# Patient Record
Sex: Male | Born: 1935 | Race: White | Hispanic: No | Marital: Married | Smoking: Former smoker
Health system: Southern US, Community
[De-identification: ages and names within clinical notes are randomized; demographics above are authoritative.]

## PROBLEM LIST (undated history)

## (undated) DIAGNOSIS — G479 Sleep disorder, unspecified: Secondary | ICD-10-CM

## (undated) DIAGNOSIS — T7840XA Allergy, unspecified, initial encounter: Secondary | ICD-10-CM

## (undated) DIAGNOSIS — K635 Polyp of colon: Secondary | ICD-10-CM

## (undated) DIAGNOSIS — K219 Gastro-esophageal reflux disease without esophagitis: Secondary | ICD-10-CM

## (undated) DIAGNOSIS — M1120 Other chondrocalcinosis, unspecified site: Secondary | ICD-10-CM

## (undated) DIAGNOSIS — K5792 Diverticulitis of intestine, part unspecified, without perforation or abscess without bleeding: Secondary | ICD-10-CM

## (undated) DIAGNOSIS — C801 Malignant (primary) neoplasm, unspecified: Secondary | ICD-10-CM

## (undated) HISTORY — DX: Allergy, unspecified, initial encounter: T78.40XA

## (undated) HISTORY — DX: Sleep disorder, unspecified: G47.9

## (undated) HISTORY — DX: Polyp of colon: K63.5

## (undated) HISTORY — DX: Diverticulitis of intestine, part unspecified, without perforation or abscess without bleeding: K57.92

## (undated) HISTORY — DX: Other chondrocalcinosis, unspecified site: M11.20

## (undated) HISTORY — DX: Malignant (primary) neoplasm, unspecified: C80.1

## (undated) HISTORY — PX: PROSTATE SURGERY: SHX751

## (undated) HISTORY — DX: Gastro-esophageal reflux disease without esophagitis: K21.9

---

## 1996-02-04 HISTORY — PX: COLON SURGERY: SHX602

## 2001-06-18 ENCOUNTER — Encounter: Payer: Self-pay | Admitting: Internal Medicine

## 2003-07-25 ENCOUNTER — Encounter (INDEPENDENT_AMBULATORY_CARE_PROVIDER_SITE_OTHER): Payer: Self-pay | Admitting: Internal Medicine

## 2004-05-10 ENCOUNTER — Ambulatory Visit: Payer: Self-pay | Admitting: Internal Medicine

## 2004-07-09 ENCOUNTER — Ambulatory Visit: Payer: Self-pay | Admitting: Family Medicine

## 2004-11-15 ENCOUNTER — Ambulatory Visit: Payer: Self-pay | Admitting: Internal Medicine

## 2004-11-22 ENCOUNTER — Ambulatory Visit: Payer: Self-pay | Admitting: Internal Medicine

## 2004-11-26 ENCOUNTER — Ambulatory Visit: Payer: Self-pay | Admitting: Oncology

## 2004-12-05 ENCOUNTER — Encounter: Payer: Self-pay | Admitting: Internal Medicine

## 2004-12-19 ENCOUNTER — Other Ambulatory Visit: Admission: RE | Admit: 2004-12-19 | Discharge: 2004-12-19 | Payer: Self-pay | Admitting: Oncology

## 2004-12-19 ENCOUNTER — Encounter (INDEPENDENT_AMBULATORY_CARE_PROVIDER_SITE_OTHER): Payer: Self-pay | Admitting: *Deleted

## 2005-01-03 ENCOUNTER — Ambulatory Visit: Payer: Self-pay | Admitting: Internal Medicine

## 2005-01-09 ENCOUNTER — Ambulatory Visit: Payer: Self-pay | Admitting: Internal Medicine

## 2005-01-28 ENCOUNTER — Ambulatory Visit: Payer: Self-pay | Admitting: Family Medicine

## 2005-03-04 ENCOUNTER — Ambulatory Visit: Payer: Self-pay | Admitting: Internal Medicine

## 2005-05-23 ENCOUNTER — Ambulatory Visit: Payer: Self-pay | Admitting: Internal Medicine

## 2005-11-17 ENCOUNTER — Ambulatory Visit: Payer: Self-pay | Admitting: Internal Medicine

## 2006-05-22 ENCOUNTER — Ambulatory Visit: Payer: Self-pay | Admitting: Internal Medicine

## 2006-05-22 LAB — CONVERTED CEMR LAB
BUN: 11 mg/dL (ref 6–23)
Basophils Absolute: 0 10*3/uL (ref 0.0–0.1)
CO2: 28 meq/L (ref 19–32)
Calcium: 8.8 mg/dL (ref 8.4–10.5)
Chloride: 106 meq/L (ref 96–112)
Eosinophils Absolute: 0.1 10*3/uL (ref 0.0–0.6)
Eosinophils Relative: 2.4 % (ref 0.0–5.0)
GFR calc Af Amer: 123 mL/min
Glucose, Bld: 95 mg/dL (ref 70–99)
HCT: 40.4 % (ref 39.0–52.0)
MCV: 105.7 fL — ABNORMAL HIGH (ref 78.0–100.0)
Neutrophils Relative %: 31.2 % — ABNORMAL LOW (ref 43.0–77.0)
Platelets: 122 10*3/uL — ABNORMAL LOW (ref 150–400)
Potassium: 4.3 meq/L (ref 3.5–5.1)
RBC: 3.83 M/uL — ABNORMAL LOW (ref 4.22–5.81)
RDW: 12.8 % (ref 11.5–14.6)
WBC: 3.4 10*3/uL — ABNORMAL LOW (ref 4.5–10.5)

## 2006-10-26 DIAGNOSIS — Z8546 Personal history of malignant neoplasm of prostate: Secondary | ICD-10-CM | POA: Insufficient documentation

## 2006-10-26 DIAGNOSIS — K219 Gastro-esophageal reflux disease without esophagitis: Secondary | ICD-10-CM

## 2006-10-26 DIAGNOSIS — D469 Myelodysplastic syndrome, unspecified: Secondary | ICD-10-CM

## 2006-10-26 DIAGNOSIS — Z8719 Personal history of other diseases of the digestive system: Secondary | ICD-10-CM | POA: Insufficient documentation

## 2006-10-26 DIAGNOSIS — G479 Sleep disorder, unspecified: Secondary | ICD-10-CM | POA: Insufficient documentation

## 2006-10-26 DIAGNOSIS — Z860100 Personal history of colon polyps, unspecified: Secondary | ICD-10-CM | POA: Insufficient documentation

## 2006-10-26 DIAGNOSIS — J45909 Unspecified asthma, uncomplicated: Secondary | ICD-10-CM | POA: Insufficient documentation

## 2006-10-26 DIAGNOSIS — J449 Chronic obstructive pulmonary disease, unspecified: Secondary | ICD-10-CM

## 2006-10-26 DIAGNOSIS — J309 Allergic rhinitis, unspecified: Secondary | ICD-10-CM | POA: Insufficient documentation

## 2006-11-20 ENCOUNTER — Ambulatory Visit: Payer: Self-pay | Admitting: Internal Medicine

## 2006-11-23 LAB — CONVERTED CEMR LAB
ALT: 24 units/L (ref 0–53)
AST: 25 units/L (ref 0–37)
BUN: 7 mg/dL (ref 6–23)
Basophils Relative: 0.4 % (ref 0.0–1.0)
Bilirubin, Direct: 0.4 mg/dL — ABNORMAL HIGH (ref 0.0–0.3)
Creatinine, Ser: 0.8 mg/dL (ref 0.4–1.5)
Eosinophils Relative: 1.7 % (ref 0.0–5.0)
GFR calc Af Amer: 123 mL/min
GFR calc non Af Amer: 102 mL/min
HCT: 41.7 % (ref 39.0–52.0)
Hemoglobin: 14.5 g/dL (ref 13.0–17.0)
Monocytes Absolute: 0.8 10*3/uL — ABNORMAL HIGH (ref 0.2–0.7)
Neutrophils Relative %: 46.7 % (ref 43.0–77.0)
Phosphorus: 2.6 mg/dL (ref 2.3–4.6)
Potassium: 4.8 meq/L (ref 3.5–5.1)
RBC: 3.95 M/uL — ABNORMAL LOW (ref 4.22–5.81)
RDW: 12.8 % (ref 11.5–14.6)
Sodium: 138 meq/L (ref 135–145)
Total Bilirubin: 2.2 mg/dL — ABNORMAL HIGH (ref 0.3–1.2)
Total Protein: 7.3 g/dL (ref 6.0–8.3)
WBC: 4.4 10*3/uL — ABNORMAL LOW (ref 4.5–10.5)

## 2007-02-10 ENCOUNTER — Telehealth (INDEPENDENT_AMBULATORY_CARE_PROVIDER_SITE_OTHER): Payer: Self-pay | Admitting: *Deleted

## 2007-03-26 ENCOUNTER — Ambulatory Visit: Payer: Self-pay | Admitting: Internal Medicine

## 2007-05-07 ENCOUNTER — Ambulatory Visit: Payer: Self-pay | Admitting: Internal Medicine

## 2007-05-10 LAB — CONVERTED CEMR LAB
Albumin: 4.1 g/dL (ref 3.5–5.2)
Basophils Absolute: 0 10*3/uL (ref 0.0–0.1)
Basophils Relative: 0.4 % (ref 0.0–1.0)
Chloride: 103 meq/L (ref 96–112)
Creatinine, Ser: 0.9 mg/dL (ref 0.4–1.5)
Eosinophils Absolute: 0.1 10*3/uL (ref 0.0–0.7)
GFR calc Af Amer: 107 mL/min
GFR calc non Af Amer: 89 mL/min
MCHC: 33.4 g/dL (ref 30.0–36.0)
MCV: 106.6 fL — ABNORMAL HIGH (ref 78.0–100.0)
Neutrophils Relative %: 38.5 % — ABNORMAL LOW (ref 43.0–77.0)
Phosphorus: 2.8 mg/dL (ref 2.3–4.6)
RBC: 4.15 M/uL — ABNORMAL LOW (ref 4.22–5.81)
RDW: 13.9 % (ref 11.5–14.6)
Sodium: 137 meq/L (ref 135–145)
TSH: 1.55 microintl units/mL (ref 0.35–5.50)

## 2007-11-26 ENCOUNTER — Ambulatory Visit: Payer: Self-pay | Admitting: Internal Medicine

## 2007-11-29 LAB — CONVERTED CEMR LAB
Eosinophils Absolute: 0.1 10*3/uL (ref 0.0–0.7)
HCT: 41.3 % (ref 39.0–52.0)
MCV: 107.1 fL — ABNORMAL HIGH (ref 78.0–100.0)
Monocytes Absolute: 0.7 10*3/uL (ref 0.1–1.0)
PSA: 0.02 ng/mL — ABNORMAL LOW (ref 0.10–4.00)
Platelets: 108 10*3/uL — ABNORMAL LOW (ref 150–400)
RDW: 13.1 % (ref 11.5–14.6)

## 2008-03-09 ENCOUNTER — Telehealth: Payer: Self-pay | Admitting: Internal Medicine

## 2008-05-26 ENCOUNTER — Ambulatory Visit: Payer: Self-pay | Admitting: Internal Medicine

## 2008-05-29 LAB — CONVERTED CEMR LAB
AST: 27 units/L (ref 0–37)
Basophils Relative: 0.8 % (ref 0.0–3.0)
Creatinine, Ser: 0.8 mg/dL (ref 0.4–1.5)
Eosinophils Relative: 2 % (ref 0.0–5.0)
Glucose, Bld: 97 mg/dL (ref 70–99)
Lymphocytes Relative: 42 % (ref 12.0–46.0)
MCHC: 35.6 g/dL (ref 30.0–36.0)
Neutrophils Relative %: 36.9 % — ABNORMAL LOW (ref 43.0–77.0)
Phosphorus: 3.2 mg/dL (ref 2.3–4.6)
RBC: 4.28 M/uL (ref 4.22–5.81)
Sodium: 140 meq/L (ref 135–145)
TSH: 1.87 microintl units/mL (ref 0.35–5.50)
WBC: 3.7 10*3/uL — ABNORMAL LOW (ref 4.5–10.5)

## 2008-07-11 ENCOUNTER — Ambulatory Visit: Payer: Self-pay | Admitting: Family Medicine

## 2008-11-24 ENCOUNTER — Ambulatory Visit: Payer: Self-pay | Admitting: Internal Medicine

## 2008-11-27 LAB — CONVERTED CEMR LAB
CO2: 29 meq/L (ref 19–32)
Creatinine, Ser: 0.8 mg/dL (ref 0.4–1.5)
Eosinophils Relative: 1.3 % (ref 0.0–5.0)
GFR calc non Af Amer: 100.86 mL/min (ref >60)
HCT: 42.3 % (ref 39.0–52.0)
Lymphocytes Relative: 34.5 % (ref 12.0–46.0)
Lymphs Abs: 1.7 10*3/uL (ref 0.7–4.0)
Monocytes Relative: 14.3 % — ABNORMAL HIGH (ref 3.0–12.0)
Neutrophils Relative %: 49.6 % (ref 43.0–77.0)
PSA: 0.01 ng/mL — ABNORMAL LOW (ref 0.10–4.00)
Platelets: 110 10*3/uL — ABNORMAL LOW (ref 150.0–400.0)
Sodium: 138 meq/L (ref 135–145)
WBC: 4.9 10*3/uL (ref 4.5–10.5)

## 2009-01-10 ENCOUNTER — Ambulatory Visit: Payer: Self-pay | Admitting: Family Medicine

## 2009-03-09 ENCOUNTER — Telehealth: Payer: Self-pay | Admitting: Family Medicine

## 2009-03-23 ENCOUNTER — Encounter: Payer: Self-pay | Admitting: Internal Medicine

## 2009-04-06 ENCOUNTER — Telehealth: Payer: Self-pay | Admitting: Internal Medicine

## 2009-05-30 ENCOUNTER — Ambulatory Visit: Payer: Self-pay | Admitting: Internal Medicine

## 2009-05-30 DIAGNOSIS — K5909 Other constipation: Secondary | ICD-10-CM

## 2009-11-14 ENCOUNTER — Ambulatory Visit: Payer: Self-pay | Admitting: Internal Medicine

## 2009-11-15 LAB — CONVERTED CEMR LAB
AST: 26 units/L (ref 0–37)
BUN: 11 mg/dL (ref 6–23)
Basophils Absolute: 0 10*3/uL (ref 0.0–0.1)
CO2: 31 meq/L (ref 19–32)
Creatinine, Ser: 0.8 mg/dL (ref 0.4–1.5)
Eosinophils Absolute: 0.1 10*3/uL (ref 0.0–0.7)
GFR calc non Af Amer: 106.72 mL/min (ref >60)
Glucose, Bld: 90 mg/dL (ref 70–99)
HCT: 41.9 % (ref 39.0–52.0)
Lymphs Abs: 2.1 10*3/uL (ref 0.7–4.0)
MCHC: 34.7 g/dL (ref 30.0–36.0)
Monocytes Relative: 15.5 % — ABNORMAL HIGH (ref 3.0–12.0)
Neutro Abs: 2 10*3/uL (ref 1.4–7.7)
PSA: 0.01 ng/mL — ABNORMAL LOW (ref 0.10–4.00)
Phosphorus: 3.6 mg/dL (ref 2.3–4.6)
Platelets: 133 10*3/uL — ABNORMAL LOW (ref 150.0–400.0)
RDW: 13.4 % (ref 11.5–14.6)
Sodium: 139 meq/L (ref 135–145)
TSH: 1.79 microintl units/mL (ref 0.35–5.50)
Total Bilirubin: 2.2 mg/dL — ABNORMAL HIGH (ref 0.3–1.2)

## 2010-03-03 LAB — CONVERTED CEMR LAB
AST: 25 units/L (ref 0–37)
Albumin: 4.2 g/dL (ref 3.5–5.2)
Alkaline Phosphatase: 55 units/L (ref 39–117)
BUN: 8 mg/dL (ref 6–23)
Basophils Relative: 0.5 % (ref 0.0–3.0)
Bilirubin, Direct: 0.4 mg/dL — ABNORMAL HIGH (ref 0.0–0.3)
CO2: 29 meq/L (ref 19–32)
Chloride: 103 meq/L (ref 96–112)
Eosinophils Relative: 1.5 % (ref 0.0–5.0)
GFR calc non Af Amer: 87.91 mL/min (ref >60)
HCT: 40.4 % (ref 39.0–52.0)
Hemoglobin: 14.1 g/dL (ref 13.0–17.0)
Lymphs Abs: 1.6 10*3/uL (ref 0.7–4.0)
Monocytes Relative: 15.9 % — ABNORMAL HIGH (ref 3.0–12.0)
Neutro Abs: 1.9 10*3/uL (ref 1.4–7.7)
Potassium: 4.6 meq/L (ref 3.5–5.1)
RBC: 3.78 M/uL — ABNORMAL LOW (ref 4.22–5.81)
TSH: 2.04 microintl units/mL (ref 0.35–5.50)
Total Protein: 6.7 g/dL (ref 6.0–8.3)
WBC: 4.4 10*3/uL — ABNORMAL LOW (ref 4.5–10.5)

## 2010-03-05 NOTE — Assessment & Plan Note (Signed)
Summary: 8:15 APPT 6 MONTH FOLLOW UP/[facility]   Vital Signs:  Patient profile:   76 year old male Weight:      181 pounds Temp:     97.8 degrees F oral BP sitting:   128 / 70  (left arm) Cuff size:   regular  Vitals Entered By: Mervin Hack CMA [facility]) (May 30, 2009 8:24 AM) CC: 6 month follow-up   History of Present Illness: DOing okay  some trouble with allergies makes him cough some does well with OTC antihistamine/decongestant and flonase  asthma not overly acting up No nocturnal cough rarely uses rescue inhaler  Trouble with constipation uses MOM about once a week discussed using miralax daily --he has but never used  Sleeps fairly well Occ restlessness occ daytime sleepiness--doesn't nap though  Gets tired easier No change in fatigue though no exercise except yard work    Allergies: No Known Drug Allergies  Past History:  Past medical, surgical, family and social histories (including risk factors) reviewed for relevance to current acute and chronic problems.  Past Medical History: Allergic rhinitis Asthma Prostate cancer, hx of Colonic polyps, hx of COPD Diverticulitis, hx of GERD Myelodysplasia--11/06----------------------Dr Cherene Altes   [phone] Duodenal polyps Sleep disturbance Constipation  Past Surgical History: Reviewed history from 10/26/2006 and no changes required. Kidney stone   ~1960 Colonic perf/ostomy 1998 Prostatectomy 9/99 EGD- Schatzki's ring 09/03  Family History: Reviewed history from 10/26/2006 and no changes required. Dad died @85  of old age. Had CLL also Mom died @89  of old age Brother died of MI @66  No DM or other cancer  Social History: Reviewed history from 10/26/2006 and no changes required. Marital Status: Married Children: 1 child Occupation: Retired- Production designer, theatre/television/film. Psychologist, occupational  (some asbestos work in past) Former Smoker--cigars Alcohol use-no sig use  Review of Systems       appetite is fine weight is up  2# voids okay  Physical Exam  General:  alert and normal appearance.   Neck:  supple, no masses, no thyromegaly, and no cervical lymphadenopathy.   Lungs:  normal respiratory effort, no intercostal retractions, and no accessory muscle use.  Good breath sounds and normal exp phase but very slight exp wheeze Heart:  normal rate, regular rhythm, no murmur, and no gallop.   Abdomen:  soft and non-tender.   Msk:  no joint tenderness and no joint swelling.   Extremities:  no edema Psych:  normally interactive, good eye contact, not anxious appearing, and not depressed appearing.     Impression & Recommendations:  Problem # 1:  ASTHMA (ICD-493.90) Assessment Unchanged  mild, intermittent seems to have adequate control--even in allergy season  His updated medication list for this problem includes:    Spiriva Handihaler 18 Mcg Caps (Tiotropium bromide monohydrate) .Marland Kitchen... Daily    Ventolin Hfa 108 (90 Base) Mcg/act Aers (Albuterol sulfate) .Marland Kitchen... 2 puffs three times a day as needed  Orders: Spirometry w/Graph (94010)  Problem # 2:  CONSTIPATION, CHRONIC (ICD-564.09) Assessment: Deteriorated  discussed adding daily miralax  His updated medication list for this problem includes:    Miralax Powd (Polyethylene glycol 3350) .Marland Kitchen... 1 capful mixed in water daily to prevent constipation  Problem # 3:  SLEEP DISORDER (ICD-780.50) Assessment: Unchanged doing okay in general with trazodone  Problem # 4:  GERD (ICD-530.81) Assessment: Unchanged stomach okay with meds  His updated medication list for this problem includes:    Omeprazole 20 Mg Cpdr (Omeprazole) .Marland Kitchen... Take 1 capsule by mouth twice a  day  Problem # 5:  ALLERGIC RHINITIS (ICD-477.9) Assessment: Unchanged dong okay with nasal spray and OTC meds  The following medications were removed from the medication list:    Fluticasone Propionate 50 Mcg/act Susp (Fluticasone propionate) .Marland Kitchen... 2 sprays each nostril daily for allergies His  updated medication list for this problem includes:    Flonase 50 Mcg/act Susp (Fluticasone propionate) .Marland Kitchen... As needed  Complete Medication List: 1)  Trazodone Hcl 150 Mg Tabs (Trazodone hcl) .... Take one by mouth at bedtime 2)  Flonase 50 Mcg/act Susp (Fluticasone propionate) .... As needed 3)  Spiriva Handihaler 18 Mcg Caps (Tiotropium bromide monohydrate) .... Daily 4)  Omeprazole 20 Mg Cpdr (Omeprazole) .... Take 1 capsule by mouth twice a day 5)  Ventolin Hfa 108 (90 Base) Mcg/act Aers (Albuterol sulfate) .... 2 puffs three times a day as needed 6)  Miralax Powd (Polyethylene glycol 3350) .Marland Kitchen.. 1 capful mixed in water daily to prevent constipation  Other Orders: TLB-CBC Platelet - w/Differential (85025-CBCD) Venipuncture (16109) TLB-PSA (Prostate Specific Antigen) (84153-PSA) TLB-Renal Function Panel (80069-RENAL) TLB-Hepatic/Liver Function Pnl (80076-HEPATIC) TLB-TSH (Thyroid Stimulating Hormone) (60454-UJW)  Patient Instructions: 1)  Please schedule a follow-up appointment in 6 months .  2)  Please start miralax daily to regulate bowels  Current Allergies (reviewed today): No known allergies

## 2010-03-05 NOTE — Progress Notes (Signed)
Summary: Rx Trazodone  Phone Note Refill Request Call back at [phone] Message from:  [facility] on April 06, 2009 9:44 AM  Refills Requested: Medication #1:  TRAZODONE HCL 150 MG TABS Take one by mouth at bedtime   Last Refilled: 03/09/2009 Received faxed refill request, please advise form in your IN box   Method Requested: Fax to Local Pharmacy Initial call taken by: Linde Gillis CMA [facility]),  April 06, 2009 9:45 AM  Follow-up for Phone Call        per Dr. Alphonsus Sias ( computer was down) refill 1 year.  Follow-up by: Mervin Hack CMA [facility]),  April 06, 2009 2:07 PM  Additional Follow-up for Phone Call Additional follow up Details #1::        Rx faxed to pharmacy Additional Follow-up by: DeShannon Smith CMA [facility]),  April 06, 2009 2:07 PM    Prescriptions: TRAZODONE HCL 150 MG TABS (TRAZODONE HCL) Take one by mouth at bedtime  #30 x 11   Entered by:   Mervin Hack CMA (AAMA)   Authorized by:   Cindee Salt MD   Signed by:   Mervin Hack CMA (AAMA) on 04/06/2009   Method used:   Electronically to        [facility]* (retail)       [address]       [place], [place]  [address]       Ph: [phone]       Fax: [phone]   RxID:   [id number]

## 2010-03-05 NOTE — Progress Notes (Signed)
Summary: refill request for trazadone  Phone Note Refill Request Message from:  Fax from Pharmacy  Refills Requested: Medication #1:  TRAZODONE HCL 150 MG TABS Take one by mouth at bedtime   Last Refilled: 02/06/2009 Faxed request from [facility] pharmacy, [phone].  Initial call taken by: Lowella Petties CMA,  March 09, 2009 11:48 AM  Follow-up for Phone Call        will refil once in Dr Vassie Moselle absence Follow-up by: Judith Part MD,  March 09, 2009 1:42 PM  Additional Follow-up for Phone Call Additional follow up Details #1::        Called to [facility]. Additional Follow-up by: Lowella Petties CMA,  March 09, 2009 2:55 PM    New/Updated Medications: TRAZODONE HCL 150 MG TABS (TRAZODONE HCL) Take one by mouth at bedtime Prescriptions: TRAZODONE HCL 150 MG TABS (TRAZODONE HCL) Take one by mouth at bedtime  #30 x 0   Entered and Authorized by:   Judith Part MD   Signed by:   Judith Part MD on 03/09/2009   Method used:   Telephoned to ...       [facility]* (retail)       [address]       [place], [place]  [address]       Ph: [phone]       Fax: [phone]   RxID:   [id number]

## 2010-03-05 NOTE — Assessment & Plan Note (Signed)
Summary: ROA FOR 6 MTH F/UP/JRR R/S FROM 10/11   Vital Signs:  Patient profile:   76 year old male Weight:      183 pounds BMI:     28.77 Temp:     97.4 degrees F oral Pulse rate:   76 / minute Pulse rhythm:   regular BP sitting:   144 / 86  (left arm) Cuff size:   regular  Vitals Entered By: Lowella Petties CMA (November 14, 2009 9:11 AM) CC: 6 month follow up   History of Present Illness: Doing well No new concerns  Bowels have been okay chronic constipation--goes every few days No blood Occ MOM --not taking the miralax much  Breathing is okay Allergies acting up now--uses antihistamine and ibuprofen (for the headache) No wheezing  Occ cough  Voids okay  Still feels a little fatigued No real change doesn't walk as much due to knee pain   Allergies: No Known Drug Allergies  Past History:  Past medical, surgical, family and social histories (including risk factors) reviewed for relevance to current acute and chronic problems.  Past Medical History: Reviewed history from 05/30/2009 and no changes required. Allergic rhinitis Asthma Prostate cancer, hx of Colonic polyps, hx of COPD Diverticulitis, hx of GERD Myelodysplasia--11/06----------------------Dr Cherene Altes   [phone] Duodenal polyps Sleep disturbance Constipation  Past Surgical History: Reviewed history from 10/26/2006 and no changes required. Kidney stone   ~1960 Colonic perf/ostomy 1998 Prostatectomy 9/99 EGD- Schatzki's ring 09/03  Family History: Reviewed history from 10/26/2006 and no changes required. Dad died @85  of old age. Had CLL also Mom died @89  of old age Brother died of MI @66  No DM or other cancer  Social History: Reviewed history from 10/26/2006 and no changes required. Marital Status: Married Children: 1 child Occupation: Retired- Production designer, theatre/television/film. Psychologist, occupational  (some asbestos work in past) Former Smoker--cigars Alcohol use-no sig use  Review of Systems       stomach has  been fine appetite is fine sleeps okay with the med weight fairly stable   Physical Exam  General:  alert and normal appearance.   Neck:  supple, no masses, no thyromegaly, no carotid bruits, and no cervical lymphadenopathy.   Lungs:  normal respiratory effort, no intercostal retractions, no accessory muscle use, normal breath sounds, and no crackles.  Very slight left exp wheeze or "moan" Heart:  normal rate, regular rhythm, no murmur, and no gallop.   Abdomen:  soft, non-tender, and no masses.   Msk:  no joint tenderness and no joint swelling.   Extremities:  no edema Psych:  normally interactive, good eye contact, not anxious appearing, and not depressed appearing.     Impression & Recommendations:  Problem # 1:  COPD (ICD-496) Assessment Unchanged stable resp and functional status some increased cough during hay fever season--uses OTC meds  His updated medication list for this problem includes:    Spiriva Handihaler 18 Mcg Caps (Tiotropium bromide monohydrate) .Marland Kitchen... Daily    Ventolin Hfa 108 (90 Base) Mcg/act Aers (Albuterol sulfate) .Marland Kitchen... 2 puffs three times a day as needed  Problem # 2:  SLEEP DISORDER (ICD-780.50) Assessment: Unchanged does okay with the trazodone  Problem # 3:  MYELODYSPLASIA (ICD-742.59) Assessment: Unchanged  mild stable fatigue will check CBC  Orders: TLB-CBC Platelet - w/Differential (85025-CBCD) TLB-Renal Function Panel (80069-RENAL) TLB-Hepatic/Liver Function Pnl (80076-HEPATIC) TLB-TSH (Thyroid Stimulating Hormone) (84443-TSH) Venipuncture ([address])  Problem # 4:  CONSTIPATION, CHRONIC (ICD-564.09) Assessment: Unchanged discussed trying the miralax 3 times per week  His updated medication list for this problem includes:    Miralax Powd (Polyethylene glycol 3350) .Marland Kitchen... 1 capful mixed in water three times a week  to prevent constipation  Complete Medication List: 1)  Trazodone Hcl 150 Mg Tabs (Trazodone hcl) .... Take one by mouth at  bedtime 2)  Flonase 50 Mcg/act Susp (Fluticasone propionate) .... As needed 3)  Spiriva Handihaler 18 Mcg Caps (Tiotropium bromide monohydrate) .... Daily 4)  Omeprazole 20 Mg Cpdr (Omeprazole) .... Take 1 capsule by mouth twice a day 5)  Ventolin Hfa 108 (90 Base) Mcg/act Aers (Albuterol sulfate) .... 2 puffs three times a day as needed 6)  Miralax Powd (Polyethylene glycol 3350) .Marland Kitchen.. 1 capful mixed in water three times a week  to prevent constipation  Other Orders: Flu Vaccine 58yrs + MEDICARE PATIENTS (G9562) Administration Flu vaccine - MCR (G0008) TLB-PSA (Prostate Specific Antigen) (84153-PSA)  Patient Instructions: 1)  Please schedule a follow-up appointment in 6 months .   Prior Medications (reviewed today): TRAZODONE HCL 150 MG TABS (TRAZODONE HCL) Take one by mouth at bedtime FLONASE 50 MCG/ACT SUSP (FLUTICASONE PROPIONATE) as needed SPIRIVA HANDIHALER 18 MCG CAPS (TIOTROPIUM BROMIDE MONOHYDRATE) daily OMEPRAZOLE 20 MG CPDR (OMEPRAZOLE) Take 1 capsule by mouth twice a day VENTOLIN HFA 108 (90 BASE) MCG/ACT  AERS (ALBUTEROL SULFATE) 2 puffs three times a day as needed Current Allergies: No known allergies Flu Vaccine Consent Questions     Do you have a history of severe allergic reactions to this vaccine? no    Any prior history of allergic reactions to egg and/or gelatin? no    Do you have a sensitivity to the preservative Thimersol? no    Do you have a past history of Guillan-Barre Syndrome? no    Do you currently have an acute febrile illness? no    Have you ever had a severe reaction to latex? no    Vaccine information given and explained to patient? yes    Are you currently pregnant? no    Lot Number:[id number]   Exp Date:08/03/2010   Site Given  Left Deltoid IMergies: No known allergies     .lbmedflu

## 2010-03-05 NOTE — Letter (Signed)
Summary: Cancer Registry [facility]  Cancer Registry [facility]   Imported By: Lanelle Bal 03/27/2009 08:25:00  _____________________________________________________________________  External Attachment:    Type:   Image     Comment:   External Document

## 2010-03-20 ENCOUNTER — Encounter: Payer: Self-pay | Admitting: Internal Medicine

## 2010-04-05 ENCOUNTER — Telehealth: Payer: Self-pay | Admitting: Internal Medicine

## 2010-04-11 NOTE — Progress Notes (Signed)
Summary: refill request for trazodone  Phone Note Refill Request Message from:  Fax from Pharmacy  Refills Requested: Medication #1:  TRAZODONE HCL 150 MG TABS Take one by mouth at bedtime   Last Refilled: 03/07/2010 Faxed request from [facility] pharmacy is on your desk.  Initial call taken by: Lowella Petties CMA, AAMA,  April 05, 2010 11:22 AM  Follow-up for Phone Call        Rx completed in Dr. Tiajuana Amass Follow-up by: Cindee Salt MD,  April 05, 2010 1:29 PM    New/Updated Medications: TRAZODONE HCL 150 MG TABS (TRAZODONE HCL) Take one by mouth at bedtime Prescriptions: TRAZODONE HCL 150 MG TABS (TRAZODONE HCL) Take one by mouth at bedtime  #30 x 11   Entered and Authorized by:   Cindee Salt MD   Signed by:   Cindee Salt MD on 04/05/2010   Method used:   Electronically to        [facility]* (retail)       [address]       [place], [place]  [address]       Ph: [phone]       Fax: [phone]   RxID:   [id number]

## 2010-04-15 ENCOUNTER — Encounter: Payer: Self-pay | Admitting: Internal Medicine

## 2010-04-23 NOTE — Letter (Signed)
Summary: [place] Cancer Registry Follow [facility]  [place] [facility] Registry Follow [facility]   Imported By: Maryln Gottron 04/19/2010 13:54:46  _____________________________________________________________________  External Attachment:    Type:   Image     Comment:   External Document

## 2010-05-14 ENCOUNTER — Encounter: Payer: Self-pay | Admitting: Internal Medicine

## 2010-05-14 ENCOUNTER — Ambulatory Visit (INDEPENDENT_AMBULATORY_CARE_PROVIDER_SITE_OTHER): Payer: Medicare Other | Admitting: Internal Medicine

## 2010-05-14 VITALS — BP 130/80 | HR 74 | Temp 97.6°F | Ht 67.0 in | Wt 186.0 lb

## 2010-05-14 DIAGNOSIS — J449 Chronic obstructive pulmonary disease, unspecified: Secondary | ICD-10-CM

## 2010-05-14 DIAGNOSIS — J309 Allergic rhinitis, unspecified: Secondary | ICD-10-CM

## 2010-05-14 DIAGNOSIS — Q068 Other specified congenital malformations of spinal cord: Secondary | ICD-10-CM

## 2010-05-14 DIAGNOSIS — Z8546 Personal history of malignant neoplasm of prostate: Secondary | ICD-10-CM

## 2010-05-14 DIAGNOSIS — K5909 Other constipation: Secondary | ICD-10-CM

## 2010-05-14 LAB — CBC WITH DIFFERENTIAL/PLATELET
Eosinophils Relative: 1.5 % (ref 0.0–5.0)
HCT: 43 % (ref 39.0–52.0)
Hemoglobin: 14.9 g/dL (ref 13.0–17.0)
Lymphs Abs: 1.8 10*3/uL (ref 0.7–4.0)
MCV: 106.3 fl — ABNORMAL HIGH (ref 78.0–100.0)
Monocytes Absolute: 0.7 10*3/uL (ref 0.1–1.0)
Neutro Abs: 1.7 10*3/uL (ref 1.4–7.7)
Platelets: 126 10*3/uL — ABNORMAL LOW (ref 150.0–400.0)
WBC: 4.3 10*3/uL — ABNORMAL LOW (ref 4.5–10.5)

## 2010-05-14 LAB — PSA: PSA: 0.01 ng/mL — ABNORMAL LOW (ref 0.10–4.00)

## 2010-05-14 NOTE — Assessment & Plan Note (Signed)
Will check labs Mostly mild thrombocytopenia at this point

## 2010-05-14 NOTE — Patient Instructions (Signed)
Please try over the counter loratadine 10mg  1-2 tabs daily or cetirizine10mg  daily for your allergies

## 2010-05-14 NOTE — Assessment & Plan Note (Signed)
Okay now with fiber supplement

## 2010-05-14 NOTE — Progress Notes (Signed)
  Subjective:    Patient ID: [name] [name], male    DOB: August 15, 1934, 76 y.o.   MRN: [id number]  HPI Having some trouble with pollen season Has head pain and ear discomfort Uses OTC "sudafed" but isn't sure what it is  This does open his head up fairly well  No problems with asthma Lots of cough--doesn't relate to asthma though No wheezing No sig SOB  Bowels are fair Using powder fiber and not using the miralax Seems to be doing well with this and not as much gas  Occ heartburn--not a big problem  No change in exercise tolerance Does fatigue but still gets through his yard work--occ with rests Limited by right knee pain---uses heat rub and compression Occ uses ibuprofen   Past Medical History  Diagnosis Date  . Allergy   . Asthma   . Cancer     PROSTATE  . Colon polyps   . Diverticulitis   . GERD (gastroesophageal reflux disease)   . Sleep disturbance, unspecified   . Constipation     Past Surgical History  Procedure Date  . Prostate surgery   . Colon surgery 1998    COLONIC PERF/OSTOMY    Family History  Problem Relation Age of Onset  . Heart disease Brother 27    HEART ATTACK    History   Social History  . Marital Status: Married    Spouse Name: N/A    Number of Children: 1  . Years of Education: N/A   Occupational History  . RETIRED    Social History Main Topics  . Smoking status: Former Smoker    Types: Cigars  . Smokeless tobacco: Not on file  . Alcohol Use: Not on file  . Drug Use: Not on file  . Sexually Active: Not on file   Other Topics Concern  . Not on file   Social History Narrative  . No narrative on file   Review of Systems Appetite is okay---small meals but stable Weight is stable  Sleeps well PSA still done every 6 months    Objective:   Physical Exam  Constitutional: He appears well-developed and well-nourished. No distress.  HENT:  Mouth/Throat: Oropharynx is clear and moist. No oropharyngeal exudate.       TMs  fine Moderate pale nasal congestion  Neck: Normal range of motion. Neck supple. No thyromegaly present.  Cardiovascular: Normal rate, regular rhythm and normal heart sounds.  Exam reveals no gallop.   No murmur heard. Pulmonary/Chest: Effort normal and breath sounds normal. No respiratory distress. He has no wheezes. He has no rales.  Abdominal: Soft. He exhibits no mass. There is no tenderness.  Musculoskeletal: Normal range of motion. He exhibits no edema and no tenderness.  Lymphadenopathy:    He has no cervical adenopathy.  Psychiatric: He has a normal mood and affect. His behavior is normal. Judgment and thought content normal.          Assessment & Plan:

## 2010-05-14 NOTE — Assessment & Plan Note (Signed)
Stable status Not exacerbated by the pollen No changes

## 2010-05-14 NOTE — Assessment & Plan Note (Signed)
Pollen sensitive Discussed proper OTC treatment

## 2010-05-14 NOTE — Assessment & Plan Note (Signed)
Will recheck the PSA

## 2010-08-05 ENCOUNTER — Other Ambulatory Visit: Payer: Self-pay | Admitting: *Deleted

## 2010-08-05 MED ORDER — TIOTROPIUM BROMIDE MONOHYDRATE 18 MCG IN CAPS: 18.0000 ug | ORAL_CAPSULE | Freq: Every day | RESPIRATORY_TRACT | Status: DC

## 2010-08-05 MED ORDER — FLUTICASONE PROPIONATE 50 MCG/ACT NA SUSP: 1.0000 | NASAL | Status: DC | PRN

## 2010-08-27 ENCOUNTER — Other Ambulatory Visit: Payer: Self-pay | Admitting: *Deleted

## 2010-08-27 MED ORDER — TIOTROPIUM BROMIDE MONOHYDRATE 18 MCG IN CAPS: 18.0000 ug | ORAL_CAPSULE | Freq: Every day | RESPIRATORY_TRACT | Status: DC

## 2010-08-27 NOTE — Telephone Encounter (Signed)
rx sent to pharmacy by e-script  

## 2010-11-11 ENCOUNTER — Ambulatory Visit (INDEPENDENT_AMBULATORY_CARE_PROVIDER_SITE_OTHER): Payer: Medicare Other | Admitting: Internal Medicine

## 2010-11-11 ENCOUNTER — Encounter: Payer: Self-pay | Admitting: Internal Medicine

## 2010-11-11 VITALS — BP 139/86 | HR 76 | Temp 98.0°F | Ht 67.0 in | Wt 185.0 lb

## 2010-11-11 DIAGNOSIS — Q068 Other specified congenital malformations of spinal cord: Secondary | ICD-10-CM

## 2010-11-11 DIAGNOSIS — Z23 Encounter for immunization: Secondary | ICD-10-CM

## 2010-11-11 DIAGNOSIS — Z8546 Personal history of malignant neoplasm of prostate: Secondary | ICD-10-CM

## 2010-11-11 DIAGNOSIS — J45909 Unspecified asthma, uncomplicated: Secondary | ICD-10-CM

## 2010-11-11 DIAGNOSIS — K5909 Other constipation: Secondary | ICD-10-CM

## 2010-11-11 DIAGNOSIS — G479 Sleep disorder, unspecified: Secondary | ICD-10-CM

## 2010-11-11 DIAGNOSIS — K219 Gastro-esophageal reflux disease without esophagitis: Secondary | ICD-10-CM

## 2010-11-11 LAB — BASIC METABOLIC PANEL
BUN: 14 mg/dL (ref 6–23)
CO2: 28 mEq/L (ref 19–32)
GFR: 85.37 mL/min (ref >60.00)
Glucose, Bld: 93 mg/dL (ref 70–99)
Potassium: 4.6 mEq/L (ref 3.5–5.1)

## 2010-11-11 LAB — CBC WITH DIFFERENTIAL/PLATELET
Basophils Absolute: 0 10*3/uL (ref 0.0–0.1)
Eosinophils Absolute: 0.1 10*3/uL (ref 0.0–0.7)
HCT: 43.9 % (ref 39.0–52.0)
Lymphs Abs: 1.6 10*3/uL (ref 0.7–4.0)
MCHC: 33.8 g/dL (ref 30.0–36.0)
MCV: 106.6 fl — ABNORMAL HIGH (ref 78.0–100.0)
Monocytes Absolute: 0.6 10*3/uL (ref 0.1–1.0)
Monocytes Relative: 14.8 % — ABNORMAL HIGH (ref 3.0–12.0)
Platelets: 114 10*3/uL — ABNORMAL LOW (ref 150.0–400.0)
RDW: 14.1 % (ref 11.5–14.6)

## 2010-11-11 LAB — HEPATIC FUNCTION PANEL
ALT: 29 U/L (ref 0–53)
Albumin: 4.2 g/dL (ref 3.5–5.2)
Total Protein: 7.2 g/dL (ref 6.0–8.3)

## 2010-11-11 LAB — PSA: PSA: 0.01 ng/mL — ABNORMAL LOW (ref 0.10–4.00)

## 2010-11-11 NOTE — Progress Notes (Signed)
  Subjective:    Patient ID: [name] [name], male    DOB: [birth year], 76 y.o.   MRN: [id number]  HPI Doing fairly well Has been using elastic wraps on knees---helps when he does his lawn  No change in energy or activity levels No easy bruising  Sleeps fine  Breathing is okay Still with chronic cough Relates to allergies--may be easing up Still uses mask to mow  Stomach is fine Still using the omeprazole regularly Bowels have been okay  Current Outpatient Prescriptions on File Prior to Visit  Medication Sig Dispense Refill  . albuterol (VENTOLIN HFA) 108 (90 BASE) MCG/ACT inhaler Inhale 2 puffs into the lungs 3 (three) times daily as needed.        . fluticasone (FLONASE) 50 MCG/ACT nasal spray Place 1 spray into the nose as needed.  16 g  1  . Misc Natural Products (FIBER 7 PO) Take by mouth as needed.        Marland Kitchen omeprazole (PRILOSEC) 20 MG capsule Take 20 mg by mouth 2 (two) times daily.        Marland Kitchen tiotropium (SPIRIVA HANDIHALER) 18 MCG inhalation capsule Place 1 capsule (18 mcg total) into inhaler and inhale daily.  30 capsule  6  . traZODone (DESYREL) 150 MG tablet Take 150 mg by mouth at bedtime.          No Known Allergies  Past Medical History  Diagnosis Date  . Allergy   . Asthma   . Cancer     PROSTATE  . Colon polyps   . Diverticulitis   . GERD (gastroesophageal reflux disease)   . Sleep disturbance, unspecified   . Constipation     Past Surgical History  Procedure Date  . Prostate surgery   . Colon surgery 1998    COLONIC PERF/OSTOMY    Family History  Problem Relation Age of Onset  . Heart disease Brother 73    HEART ATTACK    History   Social History  . Marital Status: Married    Spouse Name: N/A    Number of Children: 1  . Years of Education: N/A   Occupational History  . RETIRED    Social History Main Topics  . Smoking status: Former Smoker    Types: Cigars  . Smokeless tobacco: Never Used  . Alcohol Use: Not on file  . Drug Use: Not  on file  . Sexually Active: Not on file   Other Topics Concern  . Not on file   Social History Narrative  . No narrative on file   Review of Systems Appetite is food Weight is stable     Objective:   Physical Exam  Constitutional: He appears well-developed and well-nourished. No distress.  Neck: Normal range of motion. Neck supple. No thyromegaly present.  Cardiovascular: Normal rate, regular rhythm, normal heart sounds and intact distal pulses.  Exam reveals no gallop.   No murmur heard. Pulmonary/Chest: Effort normal and breath sounds normal. No respiratory distress. He has no wheezes. He has no rales.  Abdominal: Soft. There is no tenderness.  Musculoskeletal: He exhibits no edema and no tenderness.  Lymphadenopathy:    He has no cervical adenopathy.  Psychiatric: He has a normal mood and affect. His behavior is normal. Judgment and thought content normal.          Assessment & Plan:

## 2010-11-11 NOTE — Assessment & Plan Note (Signed)
No bleeding or increased fatigue Mostly mild thrombocytopenia Will recheck

## 2010-11-11 NOTE — Assessment & Plan Note (Signed)
Quiet on the omeprazole No change

## 2010-11-11 NOTE — Assessment & Plan Note (Signed)
Quiet on current therapy No changes needed Only occ cough Rarely uses albuterol (less than monthly)

## 2010-11-11 NOTE — Assessment & Plan Note (Signed)
Improved No regular meds now

## 2010-11-11 NOTE — Assessment & Plan Note (Signed)
sleeping well on trazodone Will continue

## 2010-11-15 ENCOUNTER — Ambulatory Visit: Payer: Medicare Other | Admitting: Internal Medicine

## 2011-02-05 ENCOUNTER — Other Ambulatory Visit: Payer: Self-pay | Admitting: *Deleted

## 2011-02-05 MED ORDER — FLUTICASONE PROPIONATE 50 MCG/ACT NA SUSP: 1.0000 | NASAL | Status: DC | PRN

## 2011-03-17 ENCOUNTER — Other Ambulatory Visit: Payer: Self-pay | Admitting: *Deleted

## 2011-03-17 MED ORDER — ALBUTEROL SULFATE HFA 108 (90 BASE) MCG/ACT IN AERS: 2.0000 | INHALATION_SPRAY | Freq: Three times a day (TID) | RESPIRATORY_TRACT | Status: AC | PRN

## 2011-04-04 ENCOUNTER — Other Ambulatory Visit: Payer: Self-pay | Admitting: *Deleted

## 2011-04-04 MED ORDER — TRAZODONE HCL 150 MG PO TABS: 150.0000 mg | ORAL_TABLET | Freq: Every day | ORAL | Status: DC

## 2011-04-04 NOTE — Telephone Encounter (Signed)
Last refill 03/07/2011.

## 2011-04-04 NOTE — Telephone Encounter (Signed)
rx sent to pharmacy by e-script  

## 2011-04-04 NOTE — Telephone Encounter (Signed)
This can be refilled for a year 

## 2011-05-05 ENCOUNTER — Other Ambulatory Visit: Payer: Self-pay | Admitting: *Deleted

## 2011-05-05 MED ORDER — TIOTROPIUM BROMIDE MONOHYDRATE 18 MCG IN CAPS: 18.0000 ug | ORAL_CAPSULE | Freq: Every day | RESPIRATORY_TRACT | Status: DC

## 2011-05-16 ENCOUNTER — Encounter: Payer: Self-pay | Admitting: Internal Medicine

## 2011-05-16 ENCOUNTER — Ambulatory Visit (INDEPENDENT_AMBULATORY_CARE_PROVIDER_SITE_OTHER): Payer: MEDICARE | Admitting: Internal Medicine

## 2011-05-16 VITALS — BP 128/80 | HR 72 | Temp 98.4°F | Ht 67.0 in | Wt 187.0 lb

## 2011-05-16 DIAGNOSIS — K219 Gastro-esophageal reflux disease without esophagitis: Secondary | ICD-10-CM

## 2011-05-16 DIAGNOSIS — J449 Chronic obstructive pulmonary disease, unspecified: Secondary | ICD-10-CM

## 2011-05-16 DIAGNOSIS — Z0000 Encounter for general adult medical examination without abnormal findings: Secondary | ICD-10-CM | POA: Insufficient documentation

## 2011-05-16 DIAGNOSIS — Q068 Other specified congenital malformations of spinal cord: Secondary | ICD-10-CM

## 2011-05-16 LAB — CBC WITH DIFFERENTIAL/PLATELET
Basophils Absolute: 0 10*3/uL (ref 0.0–0.1)
Basophils Relative: 0.6 % (ref 0.0–3.0)
Eosinophils Absolute: 0.1 10*3/uL (ref 0.0–0.7)
HCT: 42.4 % (ref 39.0–52.0)
Hemoglobin: 14.4 g/dL (ref 13.0–17.0)
Lymphocytes Relative: 32.2 % (ref 12.0–46.0)
Lymphs Abs: 1.4 10*3/uL (ref 0.7–4.0)
MCHC: 34 g/dL (ref 30.0–36.0)
MCV: 102.6 fl — ABNORMAL HIGH (ref 78.0–100.0)
Monocytes Absolute: 0.6 10*3/uL (ref 0.1–1.0)
Neutro Abs: 2.3 10*3/uL (ref 1.4–7.7)
RBC: 4.13 Mil/uL — ABNORMAL LOW (ref 4.22–5.81)
RDW: 14.2 % (ref 11.5–14.6)

## 2011-05-16 NOTE — Assessment & Plan Note (Signed)
Doing fairly well Declines zostavax due to cost Due for PSA next time (cancer surveillance) Discussed activity

## 2011-05-16 NOTE — Assessment & Plan Note (Signed)
Generally doing okay Using albuterol a little more in pollen season Still generally controlled

## 2011-05-16 NOTE — Assessment & Plan Note (Signed)
No symptoms Mostly some thrombocytopenia Will recheck

## 2011-05-16 NOTE — Progress Notes (Signed)
Subjective:    Patient ID: [name] [name], male    DOB: Jan 25, 1935, 77 y.o.   MRN: [id number]  HPI Here for physical Stress at home with wife having early dementia---still functionally independent but forgetful Some periodic down mood and trouble with motivation but not often  Down to yearly checks for PSA  Breathing is somewhat worse with pollen No sig nasal symptoms but gets stopped up some Uses spiriva daily Has needed the albuterol more--still only once every few days. This helps Increased cough--not clearly ill though  Sleeping okay  Uses trazodone nightly  No fatigue Some trouble with knees---some pain Uses elastic braces but no meds (or rare ibuprofen)  Current Outpatient Prescriptions on File Prior to Visit  Medication Sig Dispense Refill  . albuterol (VENTOLIN HFA) 108 (90 BASE) MCG/ACT inhaler Inhale 2 puffs into the lungs 3 (three) times daily as needed.  8.5 g  1  . fluticasone (FLONASE) 50 MCG/ACT nasal spray Place 1 spray into the nose as needed.  16 g  1  . tiotropium (SPIRIVA HANDIHALER) 18 MCG inhalation capsule Place 1 capsule (18 mcg total) into inhaler and inhale daily.  30 capsule  0  . traZODone (DESYREL) 150 MG tablet Take 1 tablet (150 mg total) by mouth at bedtime.  30 tablet  11    No Known Allergies  Past Medical History  Diagnosis Date  . Allergy   . Asthma   . Cancer     PROSTATE  . Colon polyps   . Diverticulitis   . GERD (gastroesophageal reflux disease)   . Sleep disturbance, unspecified   . Constipation     Past Surgical History  Procedure Date  . Prostate surgery   . Colon surgery 1998    COLONIC PERF/OSTOMY    Family History  Problem Relation Age of Onset  . Heart disease Brother 74    HEART ATTACK    History   Social History  . Marital Status: Married    Spouse Name: N/A    Number of Children: 1  . Years of Education: N/A   Occupational History  . RETIRED    Social History Main Topics  . Smoking status: Former  Smoker    Types: Cigars  . Smokeless tobacco: Never Used  . Alcohol Use: Not on file  . Drug Use: Not on file  . Sexually Active: Not on file   Other Topics Concern  . Not on file   Social History Narrative  . No narrative on file   Review of Systems  Constitutional: Negative for fatigue and unexpected weight change.       Wears seat belt  HENT: Positive for congestion. Negative for hearing loss, rhinorrhea, dental problem and postnasal drip.        Regular with dentist  Eyes: Negative for visual disturbance.       No diplopia or unilateral vision changes   Respiratory: Positive for cough, shortness of breath and wheezing. Negative for chest tightness.   Cardiovascular: Negative for chest pain, palpitations and leg swelling.  Gastrointestinal: Negative for nausea, vomiting, abdominal pain, constipation and blood in stool.       No recent heartburn problems Only eats 2 meals per day  Genitourinary: Negative for dysuria, frequency and difficulty urinating.       No sex since prostate surgery  Musculoskeletal: Positive for arthralgias. Negative for back pain and joint swelling.       Knee pain   Skin: Negative for rash.  No suspicious lesions  Neurological: Negative for dizziness, syncope, weakness, light-headedness, numbness and headaches.  Hematological: Negative for adenopathy. Does not bruise/bleed easily.  Psychiatric/Behavioral: Positive for dysphoric mood. Negative for sleep disturbance. The patient is not nervous/anxious.        Objective:   Physical Exam  Constitutional: He is oriented to person, place, and time. He appears well-developed and well-nourished. No distress.  HENT:  Head: Normocephalic and atraumatic.  Right Ear: External ear normal.  Left Ear: External ear normal.  Mouth/Throat: Oropharynx is clear and moist. No oropharyngeal exudate.  Eyes: Conjunctivae and EOM are normal. Pupils are equal, round, and reactive to light.  Neck: Normal range of  motion. Neck supple. No thyromegaly present.  Cardiovascular: Normal rate, regular rhythm, normal heart sounds and intact distal pulses.  Exam reveals no gallop.   No murmur heard. Pulmonary/Chest: Effort normal and breath sounds normal. No respiratory distress. He has no wheezes. He has no rales.  Abdominal: Soft. There is no tenderness.  Musculoskeletal: He exhibits no edema and no tenderness.  Lymphadenopathy:    He has no cervical adenopathy.  Neurological: He is alert and oriented to person, place, and time.  Skin: No rash noted. No erythema.  Psychiatric: He has a normal mood and affect. His behavior is normal. Judgment and thought content normal.          Assessment & Plan:

## 2011-05-16 NOTE — Assessment & Plan Note (Signed)
Doing okay with rare omeprazole

## 2011-05-19 ENCOUNTER — Encounter: Payer: Self-pay | Admitting: *Deleted

## 2011-06-04 ENCOUNTER — Other Ambulatory Visit: Payer: Self-pay | Admitting: *Deleted

## 2011-06-04 MED ORDER — TIOTROPIUM BROMIDE MONOHYDRATE 18 MCG IN CAPS: 18.0000 ug | ORAL_CAPSULE | Freq: Every day | RESPIRATORY_TRACT | Status: DC

## 2011-06-04 NOTE — Telephone Encounter (Signed)
Received faxed refill request from pharmacy. Refill sent to pharmacy electronically. 

## 2011-06-05 ENCOUNTER — Other Ambulatory Visit: Payer: Self-pay | Admitting: *Deleted

## 2011-06-05 MED ORDER — FLUTICASONE PROPIONATE 50 MCG/ACT NA SUSP: 1.0000 | NASAL | Status: AC | PRN

## 2011-10-31 ENCOUNTER — Other Ambulatory Visit: Payer: Self-pay | Admitting: *Deleted

## 2011-10-31 MED ORDER — TIOTROPIUM BROMIDE MONOHYDRATE 18 MCG IN CAPS: 18.0000 ug | ORAL_CAPSULE | Freq: Every day | RESPIRATORY_TRACT | Status: AC

## 2011-11-21 ENCOUNTER — Encounter: Payer: Self-pay | Admitting: *Deleted

## 2011-11-21 ENCOUNTER — Encounter: Payer: Self-pay | Admitting: Internal Medicine

## 2011-11-21 ENCOUNTER — Ambulatory Visit (INDEPENDENT_AMBULATORY_CARE_PROVIDER_SITE_OTHER): Payer: MEDICARE | Admitting: Internal Medicine

## 2011-11-21 VITALS — BP 144/94 | HR 68 | Temp 97.5°F | Wt 177.0 lb

## 2011-11-21 DIAGNOSIS — G479 Sleep disorder, unspecified: Secondary | ICD-10-CM

## 2011-11-21 DIAGNOSIS — Q068 Other specified congenital malformations of spinal cord: Secondary | ICD-10-CM

## 2011-11-21 DIAGNOSIS — Z79899 Other long term (current) drug therapy: Secondary | ICD-10-CM

## 2011-11-21 DIAGNOSIS — J449 Chronic obstructive pulmonary disease, unspecified: Secondary | ICD-10-CM

## 2011-11-21 DIAGNOSIS — K219 Gastro-esophageal reflux disease without esophagitis: Secondary | ICD-10-CM

## 2011-11-21 DIAGNOSIS — Z23 Encounter for immunization: Secondary | ICD-10-CM

## 2011-11-21 DIAGNOSIS — J309 Allergic rhinitis, unspecified: Secondary | ICD-10-CM

## 2011-11-21 LAB — CBC WITH DIFFERENTIAL/PLATELET
Eosinophils Absolute: 0 10*3/uL (ref 0.0–0.7)
Eosinophils Relative: 0.8 % (ref 0.0–5.0)
Lymphocytes Relative: 35.9 % (ref 12.0–46.0)
MCHC: 33.5 g/dL (ref 30.0–36.0)
MCV: 103.3 fl — ABNORMAL HIGH (ref 78.0–100.0)
Monocytes Absolute: 0.6 10*3/uL (ref 0.1–1.0)
Neutrophils Relative %: 48 % (ref 43.0–77.0)
Platelets: 96 10*3/uL — ABNORMAL LOW (ref 150.0–400.0)
RBC: 4.14 Mil/uL — ABNORMAL LOW (ref 4.22–5.81)
WBC: 4.3 10*3/uL — ABNORMAL LOW (ref 4.5–10.5)

## 2011-11-21 LAB — HEPATIC FUNCTION PANEL
Bilirubin, Direct: 0.4 mg/dL — ABNORMAL HIGH (ref 0.0–0.3)
Total Bilirubin: 2.1 mg/dL — ABNORMAL HIGH (ref 0.3–1.2)

## 2011-11-21 LAB — BASIC METABOLIC PANEL
BUN: 14 mg/dL (ref 6–23)
Calcium: 8.9 mg/dL (ref 8.4–10.5)
Chloride: 103 mEq/L (ref 96–112)
Creatinine, Ser: 0.8 mg/dL (ref 0.4–1.5)

## 2011-11-21 LAB — TSH: TSH: 3.68 u[IU]/mL (ref 0.35–5.50)

## 2011-11-21 NOTE — Progress Notes (Signed)
  Subjective:    Patient ID: [name] [name], male    DOB: 1934/11/08, 77 y.o.   MRN: [id number]  HPI Doing okay No concerns  Having some ragweed symptoms Satisfied with the nasal spray Had to stop mowing the lawn---hired someone Lungs are okay but he has been using more of the albuterol-- but generally not more than once a day Lots of white phlegm Some cough Does use spacer  Sleeping pretty well Uses the trazodone most nights---does make him slow in the morning  Still with stress with wife Seems stable ---doesn't understand things but still functionally independent  No sig fatigue Feels slow in the morning then does okay Current Outpatient Prescriptions on File Prior to Visit  Medication Sig Dispense Refill  . albuterol (VENTOLIN HFA) 108 (90 BASE) MCG/ACT inhaler Inhale 2 puffs into the lungs 3 (three) times daily as needed.  8.5 g  1  . fluticasone (FLONASE) 50 MCG/ACT nasal spray Place 1 spray into the nose as needed.  16 g  11  . tiotropium (SPIRIVA HANDIHALER) 18 MCG inhalation capsule Place 1 capsule (18 mcg total) into inhaler and inhale daily.  30 capsule  11  . traZODone (DESYREL) 150 MG tablet Take 1 tablet (150 mg total) by mouth at bedtime.  30 tablet  11    No Known Allergies  Past Medical History  Diagnosis Date  . Allergy   . Asthma   . Cancer     PROSTATE  . Colon polyps   . Diverticulitis   . GERD (gastroesophageal reflux disease)   . Sleep disturbance, unspecified   . Constipation     Past Surgical History  Procedure Date  . Prostate surgery   . Colon surgery 1998    COLONIC PERF/OSTOMY    Family History  Problem Relation Age of Onset  . Heart disease Brother 4    HEART ATTACK    History   Social History  . Marital Status: Married    Spouse Name: N/A    Number of Children: 1  . Years of Education: N/A   Occupational History  . RETIRED    Social History Main Topics  . Smoking status: Former Smoker    Types: Cigars  . Smokeless  tobacco: Never Used  . Alcohol Use: Not on file  . Drug Use: Not on file  . Sexually Active: Not on file   Other Topics Concern  . Not on file   Social History Narrative  . No narrative on file   Review of Systems Bowels okay Appetite is good Weight down 10#---he has been working on this     Objective:   Physical Exam  Constitutional: He appears well-developed and well-nourished. No distress.  Neck: Normal range of motion. Neck supple. No thyromegaly present.  Cardiovascular: Normal rate, regular rhythm and normal heart sounds.  Exam reveals no gallop.   No murmur heard. Pulmonary/Chest: Effort normal. No respiratory distress. He has no wheezes. He has no rales.       Slightly decreased breath sounds and slightly prolonged expiratory phase  Musculoskeletal: He exhibits no edema and no tenderness.  Lymphadenopathy:    He has no cervical adenopathy.  Psychiatric: He has a normal mood and affect. His behavior is normal. Thought content normal.          Assessment & Plan:

## 2011-11-21 NOTE — Assessment & Plan Note (Signed)
Satisfied with the trazodone No change

## 2011-11-21 NOTE — Assessment & Plan Note (Signed)
Takes omeprazole at night and that controls symptoms

## 2011-11-21 NOTE — Assessment & Plan Note (Signed)
Stable status Using the albuterol regularly but usually only once a day Discussed fitness

## 2011-11-21 NOTE — Assessment & Plan Note (Signed)
No apparent symptoms 

## 2011-11-21 NOTE — Assessment & Plan Note (Signed)
Does okay with the fluticasone

## 2011-11-21 NOTE — Addendum Note (Signed)
Addended by: Sueanne Margarita on: 11/21/2011 08:37 AM   Modules accepted: Orders

## 2012-04-02 ENCOUNTER — Other Ambulatory Visit: Payer: Self-pay | Admitting: *Deleted

## 2012-04-02 MED ORDER — TRAZODONE HCL 150 MG PO TABS: 150.0000 mg | ORAL_TABLET | Freq: Every day | ORAL | Status: DC

## 2012-05-21 ENCOUNTER — Ambulatory Visit: Payer: MEDICARE | Admitting: Internal Medicine

## 2012-05-24 ENCOUNTER — Encounter: Payer: Self-pay | Admitting: *Deleted

## 2012-05-25 ENCOUNTER — Encounter: Payer: Self-pay | Admitting: Internal Medicine

## 2012-05-25 ENCOUNTER — Ambulatory Visit (INDEPENDENT_AMBULATORY_CARE_PROVIDER_SITE_OTHER)
Admission: RE | Admit: 2012-05-25 | Discharge: 2012-05-25 | Disposition: A | Discharge status: Home / Self Care | Payer: Medicare Other | Source: Ambulatory Visit | Attending: Internal Medicine | Admitting: Internal Medicine

## 2012-05-25 ENCOUNTER — Ambulatory Visit (INDEPENDENT_AMBULATORY_CARE_PROVIDER_SITE_OTHER): Payer: Medicare Other | Admitting: Internal Medicine

## 2012-05-25 VITALS — BP 142/88 | HR 90 | Temp 98.4°F | Wt 182.0 lb

## 2012-05-25 DIAGNOSIS — M25569 Pain in unspecified knee: Secondary | ICD-10-CM

## 2012-05-25 DIAGNOSIS — F329 Major depressive disorder, single episode, unspecified: Secondary | ICD-10-CM

## 2012-05-25 DIAGNOSIS — M25561 Pain in right knee: Secondary | ICD-10-CM

## 2012-05-25 DIAGNOSIS — G479 Sleep disorder, unspecified: Secondary | ICD-10-CM

## 2012-05-25 DIAGNOSIS — M25562 Pain in left knee: Secondary | ICD-10-CM | POA: Insufficient documentation

## 2012-05-25 DIAGNOSIS — J449 Chronic obstructive pulmonary disease, unspecified: Secondary | ICD-10-CM

## 2012-05-25 MED ORDER — TRAMADOL HCL 50 MG PO TABS: 50.0000 mg | ORAL_TABLET | Freq: Three times a day (TID) | ORAL | Status: DC | PRN

## 2012-05-25 MED ORDER — TRAZODONE HCL 50 MG PO TABS: 50.0000 mg | ORAL_TABLET | Freq: Every day | ORAL | Status: AC

## 2012-05-25 NOTE — Progress Notes (Signed)
Subjective:    Patient ID: [name] [name], male    DOB: 1934-03-07, 78 y.o.   MRN: [id number]  HPI "Not doing so good" Having bad leg pain-- knees mostly Using elastic braces and this helps some Uses ibuprofen for pain--- 2 daily (helps some) Pain at rest and with walking---has actually needed cart at grocery store No sig swelling Goes back several months---no obvious injury  Breathing is okay Some heaviness---?from URI or allergies Using albuterol about once a day still Still using the spiriva  Some fatigue---"I stay that way all the time" Trazodone may be too strong "eyes are heavy" the next morning  Still stress with wife and dementia Has had medical follow up Can do what he tells her too---but still fading in activities Daughter helps some---does the finances, etc  Current Outpatient Prescriptions on File Prior to Visit  Medication Sig Dispense Refill  . albuterol (VENTOLIN HFA) 108 (90 BASE) MCG/ACT inhaler Inhale 2 puffs into the lungs 3 (three) times daily as needed.  8.5 g  1  . fluticasone (FLONASE) 50 MCG/ACT nasal spray Place 1 spray into the nose as needed.  16 g  11  . omeprazole (PRILOSEC) 20 MG capsule Take 20 mg by mouth daily.      Marland Kitchen tiotropium (SPIRIVA HANDIHALER) 18 MCG inhalation capsule Place 1 capsule (18 mcg total) into inhaler and inhale daily.  30 capsule  11  . traZODone (DESYREL) 150 MG tablet Take 1 tablet (150 mg total) by mouth at bedtime.  30 tablet  11   No current facility-administered medications on file prior to visit.    No Known Allergies  Past Medical History  Diagnosis Date  . Allergy   . Asthma   . Cancer     PROSTATE  . Colon polyps   . Diverticulitis   . GERD (gastroesophageal reflux disease)   . Sleep disturbance, unspecified   . Constipation     Past Surgical History  Procedure Laterality Date  . Prostate surgery    . Colon surgery  1998    COLONIC PERF/OSTOMY    Family History  Problem Relation Age of Onset  .  Heart disease Brother 74    HEART ATTACK    History   Social History  . Marital Status: Married    Spouse Name: N/A    Number of Children: 1  . Years of Education: N/A   Occupational History  . RETIRED    Social History Main Topics  . Smoking status: Former Smoker    Types: Cigars  . Smokeless tobacco: Never Used  . Alcohol Use: Not on file  . Drug Use: Not on file  . Sexually Active: Not on file   Other Topics Concern  . Not on file   Social History Narrative  . No narrative on file   Review of Systems Having some ear pain Appetite is okay Weight is up 5#    Objective:   Physical Exam  Constitutional: He appears well-developed and well-nourished. No distress.  Neck: Normal range of motion. Neck supple. No thyromegaly present.  Cardiovascular: Normal rate, regular rhythm and normal heart sounds.  Exam reveals no gallop.   No murmur heard. Pulmonary/Chest: Effort normal. No respiratory distress. He has wheezes. He has no rales.  Fair air movement Mild exp wheeze but not really tight  Musculoskeletal: He exhibits no edema and no tenderness.  No knee effusions Crepitus bilaterally No clear meniscus or ligament findings in either knee  Lymphadenopathy:  He has no cervical adenopathy.  Neurological:  Trouble getting up on table  Psychiatric:  Clear change in affect--seems down Bradykinesia Some constriction of affect          Assessment & Plan:

## 2012-05-25 NOTE — Assessment & Plan Note (Signed)
Marked change in his overall persona Hard moving around Mood is down  ?related to wife and caregiving Pain related Will try better arthritis Rx--consider antidepressant

## 2012-05-25 NOTE — Assessment & Plan Note (Addendum)
Seems like it is arthritic Will check x-rays Will increase the ibuprofen Tramadol prn See back soon and consider PT/cortisone shot

## 2012-05-25 NOTE — Patient Instructions (Signed)
Please decrease the trazodone to 50 or 100mg  at bedtime as needed. Stop the 150mg  dose. Increase your ibuprofen to 2 tabs three times daily after meals. You can take the extra pain pill, tramadol, if your pain is still bad.

## 2012-05-25 NOTE — Assessment & Plan Note (Signed)
Clearly the trazodone is too much Will try decreasing the dose ?affecting his mood

## 2012-05-25 NOTE — Assessment & Plan Note (Signed)
?  some more symptoms with the pollen Still no sig exacerbation

## 2012-06-04 ENCOUNTER — Emergency Department: Payer: Self-pay | Admitting: Emergency Medicine

## 2012-06-04 ENCOUNTER — Telehealth: Payer: Self-pay | Admitting: Internal Medicine

## 2012-06-04 LAB — URINALYSIS, COMPLETE
Bacteria: NONE SEEN
Bilirubin,UR: NEGATIVE
Nitrite: NEGATIVE
Ph: 7 (ref 4.5–8.0)
Protein: NEGATIVE
Specific Gravity: 1.012 (ref 1.003–1.030)
Squamous Epithelial: NONE SEEN

## 2012-06-04 LAB — COMPREHENSIVE METABOLIC PANEL
Albumin: 4.3 g/dL (ref 3.4–5.0)
Alkaline Phosphatase: 67 U/L (ref 50–136)
Anion Gap: 6 — ABNORMAL LOW (ref 7–16)
EGFR (African American): >60
Potassium: 3.7 mmol/L (ref 3.5–5.1)
SGPT (ALT): 26 U/L (ref 12–78)
Sodium: 139 mmol/L (ref 136–145)
Total Protein: 7.9 g/dL (ref 6.4–8.2)

## 2012-06-04 LAB — CBC
HGB: 15.8 g/dL (ref 13.0–18.0)
MCHC: 35.9 g/dL (ref 32.0–36.0)
RDW: 13.5 % (ref 11.5–14.5)

## 2012-06-04 NOTE — Telephone Encounter (Signed)
Patient Information:  Caller Name: Kyvon  Phone: [phone]  Patient: [name], [name]  Gender: Male  DOB: October 06, 1934  Age: 78 Years  PCP: [facility])  Office Follow Up:  Does the office need to follow up with this patient?: No  Instructions For The Office: N/A  RN Note:  pt denies any pain just feel nervous.  Pt states he is only sweating at night but his shirt is soaked.  Pt denies any dizziness.  RN offered pt an appt for 1200 today but pt states he is going to the ER at [facility] to be seen.  Symptoms  Reason For Call & Symptoms: pt was seen in the office on 05/25/12 for knee pain.  Pt reports legs are shaking and he is having night sweats.  (night sweats are new)  Reviewed Health History In EMR: Yes  Reviewed Medications In EMR: Yes  Reviewed Allergies In EMR: Yes  Reviewed Surgeries / Procedures: Yes  Date of Onset of Symptoms: 06/01/2012  Guideline(s) Used:  No Protocol Available - Sick Adult  Disposition Per Guideline:   See Today in Office  Reason For Disposition Reached:   Nursing judgment  Advice Given:  N/A  Patient Refused Recommendation:  Patient Will Go To ED  pt states he will go to [facility] ER to be evaluated

## 2012-06-04 NOTE — Telephone Encounter (Signed)
Noted  

## 2012-06-08 ENCOUNTER — Encounter: Payer: Self-pay | Admitting: Internal Medicine

## 2012-06-08 ENCOUNTER — Ambulatory Visit (INDEPENDENT_AMBULATORY_CARE_PROVIDER_SITE_OTHER): Payer: Medicare Other | Admitting: Internal Medicine

## 2012-06-08 VITALS — BP 120/70 | HR 90 | Temp 97.9°F | Wt 179.0 lb

## 2012-06-08 DIAGNOSIS — M1710 Unilateral primary osteoarthritis, unspecified knee: Secondary | ICD-10-CM

## 2012-06-08 DIAGNOSIS — F329 Major depressive disorder, single episode, unspecified: Secondary | ICD-10-CM

## 2012-06-08 DIAGNOSIS — M1711 Unilateral primary osteoarthritis, right knee: Secondary | ICD-10-CM | POA: Insufficient documentation

## 2012-06-08 NOTE — Patient Instructions (Addendum)
Please hire or get aides to help you and your wife at home

## 2012-06-08 NOTE — Assessment & Plan Note (Signed)
Seems to be reactive Has been caring for demented wife and now he is worse Step daughter will work on getting them aide help Will see again soon

## 2012-06-08 NOTE — Assessment & Plan Note (Signed)
With significant effusion PROCEDURE Sterile prep Medial approach Ethyl chloride then 2cc plain 2% lido local 42cc clear synovial fluid removed 40mg  depomedrol and 6cc 2% plain lidocaine instilled Tolerated well Felt better

## 2012-06-08 NOTE — Progress Notes (Signed)
  Subjective:    Patient ID: [name] [name], male    DOB: [birth year], 78 y.o.   MRN: [id number]  HPI Here with wife and stepdaughter Having ongoing knee pain Pain meds not really helpful Right is worse than left  Went to ER 4 days ago [facility]) Diagnosed with pneumonia (spot on lung) Now on azithromycin No cough or fever Has had SOB walking around  Still depressed Relates to the pain in his knees Limited in movement  Still just his wife and him She needs direction. Step daughter does the bills and is trying to get them Meals on wheels  Current Outpatient Prescriptions on File Prior to Visit  Medication Sig Dispense Refill  . albuterol (VENTOLIN HFA) 108 (90 BASE) MCG/ACT inhaler Inhale 2 puffs into the lungs 3 (three) times daily as needed.  8.5 g  1  . fluticasone (FLONASE) 50 MCG/ACT nasal spray Place 1 spray into the nose as needed.  16 g  11  . ibuprofen (ADVIL,MOTRIN) 200 MG tablet Take 400 mg by mouth 3 (three) times daily after meals.      Marland Kitchen omeprazole (PRILOSEC) 20 MG capsule Take 20 mg by mouth daily.      Marland Kitchen tiotropium (SPIRIVA HANDIHALER) 18 MCG inhalation capsule Place 1 capsule (18 mcg total) into inhaler and inhale daily.  30 capsule  11  . traMADol (ULTRAM) 50 MG tablet Take 1 tablet (50 mg total) by mouth 3 (three) times daily as needed for pain.  40 tablet  0  . traZODone (DESYREL) 50 MG tablet Take 1-2 tablets (50-100 mg total) by mouth at bedtime.  60 tablet  11   No current facility-administered medications on file prior to visit.    No Known Allergies  Past Medical History  Diagnosis Date  . Allergy   . Asthma   . Cancer     PROSTATE  . Colon polyps   . Diverticulitis   . GERD (gastroesophageal reflux disease)   . Sleep disturbance, unspecified   . Constipation     Past Surgical History  Procedure Laterality Date  . Prostate surgery    . Colon surgery  1998    COLONIC PERF/OSTOMY    Family History  Problem Relation Age of Onset  . Heart  disease Brother 66    HEART ATTACK    History   Social History  . Marital Status: Married    Spouse Name: N/A    Number of Children: 1  . Years of Education: N/A   Occupational History  . RETIRED    Social History Main Topics  . Smoking status: Former Smoker    Types: Cigars  . Smokeless tobacco: Never Used  . Alcohol Use: Not on file  . Drug Use: Not on file  . Sexually Active: Not on file   Other Topics Concern  . Not on file   Social History Narrative  . No narrative on file   Review of Systems Sleeps okay Appetite is not great--not really cooking at all     Objective:   Physical Exam  Musculoskeletal:  Right knee with effusion In wheelchair  Psychiatric:  Still in pajamas Somewhat sedate and flattened affect          Assessment & Plan:

## 2012-06-10 LAB — CULTURE, BLOOD (SINGLE)

## 2012-06-29 ENCOUNTER — Encounter: Payer: Self-pay | Admitting: Internal Medicine

## 2012-06-29 ENCOUNTER — Ambulatory Visit (INDEPENDENT_AMBULATORY_CARE_PROVIDER_SITE_OTHER): Payer: Medicare Other | Admitting: Internal Medicine

## 2012-06-29 VITALS — BP 140/80 | HR 89 | Temp 98.6°F | Wt 179.0 lb

## 2012-06-29 DIAGNOSIS — M1710 Unilateral primary osteoarthritis, unspecified knee: Secondary | ICD-10-CM

## 2012-06-29 DIAGNOSIS — M1711 Unilateral primary osteoarthritis, right knee: Secondary | ICD-10-CM

## 2012-06-29 DIAGNOSIS — D469 Myelodysplastic syndrome, unspecified: Secondary | ICD-10-CM

## 2012-06-29 DIAGNOSIS — F329 Major depressive disorder, single episode, unspecified: Secondary | ICD-10-CM

## 2012-06-29 LAB — BASIC METABOLIC PANEL
CO2: 27 mEq/L (ref 19–32)
Chloride: 103 mEq/L (ref 96–112)
Sodium: 137 mEq/L (ref 135–145)

## 2012-06-29 LAB — CBC WITH DIFFERENTIAL/PLATELET
Basophils Relative: 0.5 % (ref 0.0–3.0)
Eosinophils Absolute: 0.1 10*3/uL (ref 0.0–0.7)
Hemoglobin: 15.2 g/dL (ref 13.0–17.0)
Lymphs Abs: 1.7 10*3/uL (ref 0.7–4.0)
MCHC: 34.6 g/dL (ref 30.0–36.0)
MCV: 99.3 fl (ref 78.0–100.0)
Monocytes Absolute: 0.9 10*3/uL (ref 0.1–1.0)
Neutro Abs: 2.3 10*3/uL (ref 1.4–7.7)
RBC: 4.43 Mil/uL (ref 4.22–5.81)

## 2012-06-29 LAB — HEPATIC FUNCTION PANEL
ALT: 19 U/L (ref 0–53)
Total Protein: 7.3 g/dL (ref 6.0–8.3)

## 2012-06-29 MED ORDER — CITALOPRAM HYDROBROMIDE 20 MG PO TABS: 20.0000 mg | ORAL_TABLET | Freq: Every day | ORAL | Status: DC

## 2012-06-29 NOTE — Assessment & Plan Note (Signed)
Better ?x-ray shows patellar tendon abnormality or pseudogout---but I don't think this is the limiting factor Has rolling walker to use

## 2012-06-29 NOTE — Progress Notes (Signed)
Subjective:    Patient ID: [name] [name], male    DOB: 01-17-1935, 78 y.o.   MRN: [id number]  HPI Here with step daughter  Feels some better Still having trouble walking Won't use walker but still some trouble with balance  Occasional shaking in legs Has noted trouble with handwriting recently  Now has step granddaughter helping in the house Family gets groceries and step daughter pays the bills  Some knee pain but definitely better Some cramping on left-but right still the worse one  Remains depressed---he feels every day Just sits in front of TV Has thought of death but no thoughts of suicide Not sleeping well  Current Outpatient Prescriptions on File Prior to Visit  Medication Sig Dispense Refill  . albuterol (VENTOLIN HFA) 108 (90 BASE) MCG/ACT inhaler Inhale 2 puffs into the lungs 3 (three) times daily as needed.  8.5 g  1  . fluticasone (FLONASE) 50 MCG/ACT nasal spray Place 1 spray into the nose as needed.  16 g  11  . ibuprofen (ADVIL,MOTRIN) 200 MG tablet Take 400 mg by mouth 3 (three) times daily after meals.      Marland Kitchen omeprazole (PRILOSEC) 20 MG capsule Take 20 mg by mouth daily.      Marland Kitchen tiotropium (SPIRIVA HANDIHALER) 18 MCG inhalation capsule Place 1 capsule (18 mcg total) into inhaler and inhale daily.  30 capsule  11  . traMADol (ULTRAM) 50 MG tablet Take 1 tablet (50 mg total) by mouth 3 (three) times daily as needed for pain.  40 tablet  0  . traZODone (DESYREL) 50 MG tablet Take 1-2 tablets (50-100 mg total) by mouth at bedtime.  60 tablet  11   No current facility-administered medications on file prior to visit.    No Known Allergies  Past Medical History  Diagnosis Date  . Allergy   . Asthma   . Cancer     PROSTATE  . Colon polyps   . Diverticulitis   . GERD (gastroesophageal reflux disease)   . Sleep disturbance, unspecified   . Constipation     Past Surgical History  Procedure Laterality Date  . Prostate surgery    . Colon surgery  1998   COLONIC PERF/OSTOMY    Family History  Problem Relation Age of Onset  . Heart disease Brother 77    HEART ATTACK    History   Social History  . Marital Status: Married    Spouse Name: N/A    Number of Children: 1  . Years of Education: N/A   Occupational History  . RETIRED    Social History Main Topics  . Smoking status: Former Smoker    Types: Cigars  . Smokeless tobacco: Never Used  . Alcohol Use: Not on file  . Drug Use: Not on file  . Sexually Active: Not on file   Other Topics Concern  . Not on file   Social History Narrative  . No narrative on file   Review of Systems Dizzy feeling with blurry vision the other night--resolved on its own quickly Appetite is fair Weight stable    Objective:   Physical Exam  Constitutional: He appears well-developed and well-nourished. No distress.  Neck: Normal range of motion.  Musculoskeletal: He exhibits no edema.  Lymphadenopathy:    He has no cervical adenopathy.  Neurological:  Marked bradykinesia walking  No tremor No rigidity  Psychiatric:  Flat affect Hard to judge mood Looks better than last time  Assessment & Plan:

## 2012-06-29 NOTE — Patient Instructions (Signed)
Please start the citalopram today. Cut the first 3 pills in half and take 1/2 a day for 6 days. If you have not had any significant problems with it, increase to a full pill after those 6 days.

## 2012-06-29 NOTE — Assessment & Plan Note (Signed)
Has daily depression and anhedonia Striking bradykinesia---without other signs of Parkinsons  Will try citalopram

## 2012-06-29 NOTE — Assessment & Plan Note (Signed)
No obvious fatigue to suggest severe anemia Will recheck labs to be sure hemoglobin is okay

## 2012-07-01 ENCOUNTER — Encounter: Payer: Self-pay | Admitting: *Deleted

## 2012-07-14 ENCOUNTER — Ambulatory Visit (INDEPENDENT_AMBULATORY_CARE_PROVIDER_SITE_OTHER): Payer: Medicare Other | Admitting: Internal Medicine

## 2012-07-14 ENCOUNTER — Telehealth: Payer: Self-pay

## 2012-07-14 ENCOUNTER — Encounter: Payer: Self-pay | Admitting: Internal Medicine

## 2012-07-14 VITALS — BP 160/80 | HR 103 | Temp 98.1°F

## 2012-07-14 DIAGNOSIS — R29898 Other symptoms and signs involving the musculoskeletal system: Secondary | ICD-10-CM

## 2012-07-14 DIAGNOSIS — F329 Major depressive disorder, single episode, unspecified: Secondary | ICD-10-CM

## 2012-07-14 DIAGNOSIS — F3289 Other specified depressive episodes: Secondary | ICD-10-CM

## 2012-07-14 NOTE — Patient Instructions (Signed)
Please restart the citalopram but try at bedtime. If you have problems again, stop it and call ---so I can try something else instead

## 2012-07-14 NOTE — Progress Notes (Signed)
  Subjective:    Patient ID: [name] [name], male    DOB: Aug 04, 1934, 78 y.o.   MRN: [id number]  HPI Here with step daughter  Noted swelling behind left knee last night Thinks it is in the calf muscle No fall or known injury Not overly painful but "feels like I don't have any foot"  Up till 2 days ago---able to walk around holding onto the walls, etc Wife with dementia does prepare some food Step daughter is working on [employer] to hire hellp  Current Outpatient Prescriptions on File Prior to Visit  Medication Sig Dispense Refill  . albuterol (VENTOLIN HFA) 108 (90 BASE) MCG/ACT inhaler Inhale 2 puffs into the lungs 3 (three) times daily as needed.  8.5 g  1  . citalopram (CELEXA) 20 MG tablet Take 1 tablet (20 mg total) by mouth daily.  30 tablet  5  . fluticasone (FLONASE) 50 MCG/ACT nasal spray Place 1 spray into the nose as needed.  16 g  11  . ibuprofen (ADVIL,MOTRIN) 200 MG tablet Take 400 mg by mouth 3 (three) times daily after meals.      Marland Kitchen omeprazole (PRILOSEC) 20 MG capsule Take 20 mg by mouth daily.      Marland Kitchen tiotropium (SPIRIVA HANDIHALER) 18 MCG inhalation capsule Place 1 capsule (18 mcg total) into inhaler and inhale daily.  30 capsule  11  . traMADol (ULTRAM) 50 MG tablet Take 1 tablet (50 mg total) by mouth 3 (three) times daily as needed for pain.  40 tablet  0  . traZODone (DESYREL) 50 MG tablet Take 1-2 tablets (50-100 mg total) by mouth at bedtime.  60 tablet  11   No current facility-administered medications on file prior to visit.    No Known Allergies  Past Medical History  Diagnosis Date  . Allergy   . Asthma   . Cancer     PROSTATE  . Colon polyps   . Diverticulitis   . GERD (gastroesophageal reflux disease)   . Sleep disturbance, unspecified   . Constipation     Past Surgical History  Procedure Laterality Date  . Prostate surgery    . Colon surgery  1998    COLONIC PERF/OSTOMY    Family History  Problem Relation Age of Onset  .  Heart disease Brother 37    HEART ATTACK    History   Social History  . Marital Status: Married    Spouse Name: N/A    Number of Children: 1  . Years of Education: N/A   Occupational History  . RETIRED    Social History Main Topics  . Smoking status: Former Smoker    Types: Cigars  . Smokeless tobacco: Never Used  . Alcohol Use: Not on file  . Drug Use: Not on file  . Sexually Active: Not on file   Other Topics Concern  . Not on file   Social History Narrative  . No narrative on file   Review of Systems Still not eating well Having "crazy dreams" so stopped the citalopram     Objective:   Physical Exam  Constitutional: He appears well-nourished. No distress.  Musculoskeletal:  No mass in legs or calves Fair external rotation of both hips, mild decrease in internal rotation  Neurological:  Mildly decreased strength in legs but not focal Unable to stand or walk stably now          Assessment & Plan:

## 2012-07-14 NOTE — Telephone Encounter (Signed)
Okay  Will evaluate then

## 2012-07-14 NOTE — Telephone Encounter (Signed)
Pt said both legs still shaking, lt leg pain worse now with muscles feeling tight and shaking. Can't walk without assistance;too weak to use walker, wife trying to help but she has dementia. Scheduled appt today 12:15 pm

## 2012-07-14 NOTE — Assessment & Plan Note (Signed)
Still seems to be the underlying problem---though not complete Will ask him to restart the citalopram--- can try at night

## 2012-07-14 NOTE — Assessment & Plan Note (Signed)
Doesn't seem to have had a stroke Not able to walk in home or properly do personal care now Wife also with some dementia Very high risk situation  Will have RN, PT and social work eval through home health

## 2012-07-20 ENCOUNTER — Telehealth: Payer: Self-pay | Admitting: Internal Medicine

## 2012-07-20 NOTE — Telephone Encounter (Signed)
Just got the order/referral for the patient - will be supplying skilled nursing care, checking his medications, seeing that he get physical therapy.   Concerning his medication list:  He has not been taking the Celexa, Tramadol or using the Albuterol.  He will take the Prilosec as he feels he needs it. Can reach caller at [phone] as needed.

## 2012-07-20 NOTE — Telephone Encounter (Signed)
HH note - will route to PCP.

## 2012-07-21 NOTE — Telephone Encounter (Signed)
Spoke with [name] [name]- home health and she states the pt told her he wasn't taking Celexa because it gave him nightmares, she is asking is there anything else he can try?

## 2012-07-21 NOTE — Telephone Encounter (Signed)
Please call him and urge him to try the celexa I don't really care about the other meds

## 2012-07-23 NOTE — Telephone Encounter (Signed)
Have him try sertraline 50mg  daily #30 x 1 Make sure he has a follow up soon (like 2-3 weeks)

## 2012-07-27 NOTE — Telephone Encounter (Signed)
Spoke with patient and he's sleeping well with the Trazodone, he will discuss with Dr. Alphonsus Sias at his OV on this Friday. I also spoke with Okey Regal home health nurse and she states pt doesn't want to take any of those type of meds

## 2012-07-28 NOTE — Telephone Encounter (Signed)
Okay, we will discuss at his upcoming visit

## 2012-07-29 ENCOUNTER — Telehealth: Payer: Self-pay

## 2012-07-29 NOTE — Telephone Encounter (Signed)
[name] [name] nurse with [facility] left v/m; pt has not picked up Zoloft from pharmacy; pt said is not going to take antidepressant. Pt has appt scheduled with Dr Alphonsus Sias 07/30/12 at 3 PM.

## 2012-07-29 NOTE — Telephone Encounter (Signed)
Yeah, I heard this already We will discuss it at the visit

## 2012-07-30 ENCOUNTER — Ambulatory Visit (INDEPENDENT_AMBULATORY_CARE_PROVIDER_SITE_OTHER): Payer: Medicare Other | Admitting: Internal Medicine

## 2012-07-30 ENCOUNTER — Encounter: Payer: Self-pay | Admitting: Internal Medicine

## 2012-07-30 VITALS — BP 140/80 | HR 93 | Temp 98.0°F | Wt 178.0 lb

## 2012-07-30 DIAGNOSIS — R29898 Other symptoms and signs involving the musculoskeletal system: Secondary | ICD-10-CM

## 2012-07-30 NOTE — Assessment & Plan Note (Signed)
Still no clear diagnosis but improving with the PT Mood issues seem to have resolved---will not use any antidepressants therefore

## 2012-07-30 NOTE — Progress Notes (Signed)
  Subjective:    Patient ID: [name] [name], male    DOB: [birth year], 78 y.o.   MRN: [id number]  HPI Here with stepdaughter again  He doesn't agree with the depression Sleeps well  Appetite is fine He doesn't feel that this is the problem so he is not going to take the meds  Getting PT at home Has RN from home care Now walking with just the walker  Daughter thinks he looks better also (he looks more like himself today) Thinks he had gotten down but is better now ("realizes someone does care about him")  Current Outpatient Prescriptions on File Prior to Visit  Medication Sig Dispense Refill  . albuterol (VENTOLIN HFA) 108 (90 BASE) MCG/ACT inhaler Inhale 2 puffs into the lungs 3 (three) times daily as needed.  8.5 g  1  . fluticasone (FLONASE) 50 MCG/ACT nasal spray Place 1 spray into the nose as needed.  16 g  11  . ibuprofen (ADVIL,MOTRIN) 200 MG tablet Take 400 mg by mouth 3 (three) times daily after meals.      Marland Kitchen omeprazole (PRILOSEC) 20 MG capsule Take 20 mg by mouth daily.      Marland Kitchen tiotropium (SPIRIVA HANDIHALER) 18 MCG inhalation capsule Place 1 capsule (18 mcg total) into inhaler and inhale daily.  30 capsule  11  . traMADol (ULTRAM) 50 MG tablet Take 1 tablet (50 mg total) by mouth 3 (three) times daily as needed for pain.  40 tablet  0  . traZODone (DESYREL) 50 MG tablet Take 1-2 tablets (50-100 mg total) by mouth at bedtime.  60 tablet  11   No current facility-administered medications on file prior to visit.    No Known Allergies  Past Medical History  Diagnosis Date  . Allergy   . Asthma   . Cancer     PROSTATE  . Colon polyps   . Diverticulitis   . GERD (gastroesophageal reflux disease)   . Sleep disturbance, unspecified   . Constipation     Past Surgical History  Procedure Laterality Date  . Prostate surgery    . Colon surgery  1998    COLONIC PERF/OSTOMY    Family History  Problem Relation Age of Onset  . Heart disease Brother 59    HEART ATTACK     History   Social History  . Marital Status: Married    Spouse Name: N/A    Number of Children: 1  . Years of Education: N/A   Occupational History  . RETIRED    Social History Main Topics  . Smoking status: Former Smoker    Types: Cigars  . Smokeless tobacco: Never Used  . Alcohol Use: Not on file  . Drug Use: Not on file  . Sexually Active: Not on file   Other Topics Concern  . Not on file   Social History Narrative  . No narrative on file   Review of Systems Sleeps okay Starting Meals on wheels Wife can cook some still (but does have dementia)     Objective:   Physical Exam  Psychiatric:  Finally looks better Normal speech and interaction          Assessment & Plan:

## 2012-08-24 ENCOUNTER — Telehealth: Payer: Self-pay

## 2012-08-24 NOTE — Telephone Encounter (Signed)
[name] [name] with [name] left v/m requesting verbal order for [name] 2 x a week for 4 weeks to improve balance and strength.Please advise.

## 2012-08-25 NOTE — Telephone Encounter (Signed)
Left message on VM with instructions

## 2012-08-25 NOTE — Telephone Encounter (Signed)
That is fine 

## 2012-09-20 ENCOUNTER — Telehealth: Payer: Self-pay

## 2012-09-20 NOTE — Telephone Encounter (Signed)
Pt receiving PT and when PT not there pt will not walk and try to help take care of himself; pt will urinate in floor and pt is talking ugly to his wife. Pts step daughter, Aura Fey wants Dr Alphonsus Sias  to know that Junious Dresser is not going to help with Mr Mejorado's care any more.Junious Dresser said she is the one to usually bring pt to doctor's office).

## 2012-09-20 NOTE — Telephone Encounter (Signed)
Please give him the number for [facility] or the [facility] geriatric office (we have a flyer on the wall). It sounds like they need some help at home ---with his trouble getting around and her dementia

## 2012-09-23 NOTE — Telephone Encounter (Signed)
Spoke with step-daughter and advised results, gave her the number and she will call.

## 2012-09-28 ENCOUNTER — Observation Stay: Payer: Self-pay | Admitting: Internal Medicine

## 2012-09-28 LAB — CBC WITH DIFFERENTIAL/PLATELET
Comment - H1-Com1: NORMAL
HCT: 38.8 % — ABNORMAL LOW (ref 40.0–52.0)
HGB: 13.9 g/dL (ref 13.0–18.0)
MCH: 34.5 pg — ABNORMAL HIGH (ref 26.0–34.0)
MCHC: 35.8 g/dL (ref 32.0–36.0)
MCV: 96 fL (ref 80–100)
Monocytes: 12 %
Platelet: 90 10*3/uL — ABNORMAL LOW (ref 150–440)
RDW: 13.8 % (ref 11.5–14.5)
Segmented Neutrophils: 68 %

## 2012-09-28 LAB — URINALYSIS, COMPLETE
Bilirubin,UR: NEGATIVE
Leukocyte Esterase: NEGATIVE
Nitrite: NEGATIVE
Specific Gravity: 1.019 (ref 1.003–1.030)
Squamous Epithelial: NONE SEEN
WBC UR: 3 /HPF (ref 0–5)

## 2012-09-28 LAB — TROPONIN I: Troponin-I: 0.03 ng/mL

## 2012-09-28 LAB — COMPREHENSIVE METABOLIC PANEL
Albumin: 3.5 g/dL (ref 3.4–5.0)
BUN: 16 mg/dL (ref 7–18)
Calcium, Total: 8.6 mg/dL (ref 8.5–10.1)
EGFR (African American): >60
EGFR (Non-African Amer.): >60
Glucose: 102 mg/dL — ABNORMAL HIGH (ref 65–99)
Osmolality: 271 (ref 275–301)
Potassium: 3.5 mmol/L (ref 3.5–5.1)
SGOT(AST): 31 U/L (ref 15–37)
SGPT (ALT): 18 U/L (ref 12–78)
Total Protein: 7 g/dL (ref 6.4–8.2)

## 2012-09-28 LAB — LIPASE, BLOOD: Lipase: 40 U/L — ABNORMAL LOW (ref 73–393)

## 2012-09-28 LAB — TSH: Thyroid Stimulating Horm: 2.1 u[IU]/mL

## 2012-09-29 LAB — BASIC METABOLIC PANEL
Anion Gap: 9 (ref 7–16)
Calcium, Total: 8.2 mg/dL — ABNORMAL LOW (ref 8.5–10.1)
Chloride: 105 mmol/L (ref 98–107)
Co2: 23 mmol/L (ref 21–32)
Glucose: 83 mg/dL (ref 65–99)
Osmolality: 273 (ref 275–301)
Potassium: 3.4 mmol/L — ABNORMAL LOW (ref 3.5–5.1)

## 2012-09-29 LAB — CBC WITH DIFFERENTIAL/PLATELET
Basophil #: 0 10*3/uL (ref 0.0–0.1)
Basophil %: 0.2 %
Eosinophil #: 0 10*3/uL (ref 0.0–0.7)
HCT: 35.7 % — ABNORMAL LOW (ref 40.0–52.0)
HGB: 12.9 g/dL — ABNORMAL LOW (ref 13.0–18.0)
Lymphocyte %: 16.2 %
MCHC: 36.1 g/dL — ABNORMAL HIGH (ref 32.0–36.0)
Monocyte %: 23.1 %
RBC: 3.67 10*6/uL — ABNORMAL LOW (ref 4.40–5.90)
RDW: 13.8 % (ref 11.5–14.5)
WBC: 8.4 10*3/uL (ref 3.8–10.6)

## 2012-09-30 ENCOUNTER — Ambulatory Visit: Payer: Medicare Other | Admitting: Internal Medicine

## 2012-09-30 LAB — BASIC METABOLIC PANEL
Anion Gap: 4 — ABNORMAL LOW (ref 7–16)
BUN: 12 mg/dL (ref 7–18)
Calcium, Total: 8.5 mg/dL (ref 8.5–10.1)
Chloride: 108 mmol/L — ABNORMAL HIGH (ref 98–107)
Co2: 25 mmol/L (ref 21–32)
Creatinine: 0.62 mg/dL (ref 0.60–1.30)
EGFR (African American): >60
EGFR (Non-African Amer.): >60
Glucose: 171 mg/dL — ABNORMAL HIGH (ref 65–99)
Osmolality: 278 (ref 275–301)
Potassium: 3.6 mmol/L (ref 3.5–5.1)
Sodium: 137 mmol/L (ref 136–145)

## 2012-09-30 LAB — SYNOVIAL CELL COUNT + DIFF, W/ CRYSTALS
Basophil: 0 %
Eosinophil: 0 %
Lymphocytes: 1 %
Neutrophils: 87 %
Nucleated Cell Count: 35636 /mm3
Other Cells BF: 0 %
Other Mononuclear Cells: 12 %

## 2012-09-30 LAB — URIC ACID: Uric Acid: 3.1 mg/dL — ABNORMAL LOW (ref 3.5–7.2)

## 2012-09-30 LAB — CK: CK, Total: 151 U/L (ref 35–232)

## 2012-10-01 ENCOUNTER — Encounter (HOSPITAL_COMMUNITY): Payer: Self-pay | Admitting: Internal Medicine

## 2012-10-01 ENCOUNTER — Inpatient Hospital Stay (HOSPITAL_COMMUNITY)
Admission: AD | Admit: 2012-10-01 | Discharge: 2012-10-06 | DRG: 158 | Disposition: A | Discharge status: Skilled Nursing Facility | Payer: Medicare Other | Source: Other Acute Inpatient Hospital | Attending: Internal Medicine | Admitting: Internal Medicine

## 2012-10-01 DIAGNOSIS — J45909 Unspecified asthma, uncomplicated: Secondary | ICD-10-CM

## 2012-10-01 DIAGNOSIS — Z8546 Personal history of malignant neoplasm of prostate: Secondary | ICD-10-CM

## 2012-10-01 DIAGNOSIS — M1120 Other chondrocalcinosis, unspecified site: Secondary | ICD-10-CM | POA: Diagnosis present

## 2012-10-01 DIAGNOSIS — S0300XS Dislocation of jaw, unspecified side, sequela: Secondary | ICD-10-CM

## 2012-10-01 DIAGNOSIS — K219 Gastro-esophageal reflux disease without esophagitis: Secondary | ICD-10-CM | POA: Diagnosis present

## 2012-10-01 DIAGNOSIS — Z79899 Other long term (current) drug therapy: Secondary | ICD-10-CM

## 2012-10-01 DIAGNOSIS — J449 Chronic obstructive pulmonary disease, unspecified: Secondary | ICD-10-CM

## 2012-10-01 DIAGNOSIS — S0300XA Dislocation of jaw, unspecified side, initial encounter: Secondary | ICD-10-CM

## 2012-10-01 DIAGNOSIS — M2669 Other specified disorders of temporomandibular joint: Principal | ICD-10-CM | POA: Diagnosis present

## 2012-10-01 DIAGNOSIS — E876 Hypokalemia: Secondary | ICD-10-CM | POA: Diagnosis present

## 2012-10-01 DIAGNOSIS — M6282 Rhabdomyolysis: Secondary | ICD-10-CM | POA: Diagnosis present

## 2012-10-01 DIAGNOSIS — M1711 Unilateral primary osteoarthritis, right knee: Secondary | ICD-10-CM

## 2012-10-01 DIAGNOSIS — Z9079 Acquired absence of other genital organ(s): Secondary | ICD-10-CM

## 2012-10-01 DIAGNOSIS — Z87891 Personal history of nicotine dependence: Secondary | ICD-10-CM

## 2012-10-01 DIAGNOSIS — M25561 Pain in right knee: Secondary | ICD-10-CM

## 2012-10-01 DIAGNOSIS — I10 Essential (primary) hypertension: Secondary | ICD-10-CM

## 2012-10-01 DIAGNOSIS — R29898 Other symptoms and signs involving the musculoskeletal system: Secondary | ICD-10-CM

## 2012-10-01 LAB — BASIC METABOLIC PANEL
BUN: 17 mg/dL (ref 6–23)
Chloride: 104 mEq/L (ref 96–112)
GFR calc Af Amer: >90 mL/min (ref >90)
GFR calc non Af Amer: >90 mL/min (ref >90)
Potassium: 3.5 mEq/L (ref 3.5–5.1)
Sodium: 137 mEq/L (ref 135–145)

## 2012-10-01 LAB — CBC WITH DIFFERENTIAL/PLATELET
Eosinophils Absolute: 0 10*3/uL (ref 0.0–0.7)
Hemoglobin: 13.1 g/dL (ref 13.0–17.0)
Lymphocytes Relative: 11 % — ABNORMAL LOW (ref 12–46)
Lymphs Abs: 0.4 10*3/uL — ABNORMAL LOW (ref 0.7–4.0)
Monocytes Relative: 20 % — ABNORMAL HIGH (ref 3–12)
Neutro Abs: 2.4 10*3/uL (ref 1.7–7.7)
Neutrophils Relative %: 69 % (ref 43–77)
Platelets: 213 10*3/uL (ref 150–400)
RBC: 3.83 MIL/uL — ABNORMAL LOW (ref 4.22–5.81)
WBC: 3.5 10*3/uL — ABNORMAL LOW (ref 4.0–10.5)

## 2012-10-01 LAB — CK: Total CK: 53 U/L (ref 7–232)

## 2012-10-01 MED ORDER — ONDANSETRON HCL 4 MG/2ML IJ SOLN: 4.0000 mg | Freq: Four times a day (QID) | INTRAMUSCULAR | Status: DC | PRN

## 2012-10-01 MED ORDER — TRAMADOL HCL 50 MG PO TABS
50.0000 mg | ORAL_TABLET | Freq: Three times a day (TID) | ORAL | Status: DC | PRN
  Administered 2012-10-02 – 2012-10-05 (×6): 50 mg via ORAL
  Filled 2012-10-01 (×6): qty 1

## 2012-10-01 MED ORDER — SODIUM CHLORIDE 0.9 % IV SOLN: INTRAVENOUS | Status: AC

## 2012-10-01 MED ORDER — ACETAMINOPHEN 325 MG PO TABS
650.0000 mg | ORAL_TABLET | Freq: Four times a day (QID) | ORAL | Status: DC | PRN
  Administered 2012-10-02: 325 mg via ORAL
  Administered 2012-10-03: 650 mg via ORAL
  Filled 2012-10-01 (×2): qty 2

## 2012-10-01 MED ORDER — ONDANSETRON HCL 4 MG PO TABS: 4.0000 mg | ORAL_TABLET | Freq: Four times a day (QID) | ORAL | Status: DC | PRN

## 2012-10-01 MED ORDER — TRAZODONE HCL 50 MG PO TABS
50.0000 mg | ORAL_TABLET | Freq: Every day | ORAL | Status: DC
  Administered 2012-10-01: 100 mg via ORAL
  Administered 2012-10-02: 50 mg via ORAL
  Administered 2012-10-03 – 2012-10-05 (×3): 100 mg via ORAL
  Filled 2012-10-01 (×6): qty 2

## 2012-10-01 MED ORDER — FLUTICASONE PROPIONATE 50 MCG/ACT NA SUSP
1.0000 | Freq: Every day | NASAL | Status: DC
  Administered 2012-10-01 – 2012-10-06 (×5): 1 via NASAL
  Filled 2012-10-01: qty 16

## 2012-10-01 MED ORDER — ACETAMINOPHEN 650 MG RE SUPP: 650.0000 mg | Freq: Four times a day (QID) | RECTAL | Status: DC | PRN

## 2012-10-01 MED ORDER — CALCIUM CARBONATE ANTACID 500 MG PO CHEW
1.0000 | CHEWABLE_TABLET | Freq: Every day | ORAL | Status: DC
  Administered 2012-10-01 – 2012-10-06 (×6): 200 mg via ORAL
  Filled 2012-10-01 (×6): qty 1

## 2012-10-01 MED ORDER — PANTOPRAZOLE SODIUM 40 MG PO TBEC
40.0000 mg | DELAYED_RELEASE_TABLET | Freq: Every day | ORAL | Status: DC
  Administered 2012-10-01 – 2012-10-06 (×6): 40 mg via ORAL
  Filled 2012-10-01 (×6): qty 1

## 2012-10-01 MED ORDER — ALBUTEROL SULFATE HFA 108 (90 BASE) MCG/ACT IN AERS
2.0000 | INHALATION_SPRAY | Freq: Three times a day (TID) | RESPIRATORY_TRACT | Status: DC | PRN
  Filled 2012-10-01: qty 6.7

## 2012-10-01 MED ORDER — TIOTROPIUM BROMIDE MONOHYDRATE 18 MCG IN CAPS
18.0000 ug | ORAL_CAPSULE | Freq: Every day | RESPIRATORY_TRACT | Status: DC
  Administered 2012-10-02 – 2012-10-06 (×5): 18 ug via RESPIRATORY_TRACT
  Filled 2012-10-01 (×2): qty 5

## 2012-10-01 NOTE — H&P (Signed)
[facility] History and Physical  [name] [name] UJW:[id number] DOB: [birth year] DOA: 10/01/2012  Referring physician: Patient was transferred [facility] for jaw dislocation. PCP: Tillman Abide, MD   Chief Complaint: Patient was transferred for jaw dislocation.  HPI: [name] [name] is a 78 y.o. male history prostate cancer status post prostatectomy had originally gone to [facility] because of weakness. Patient's labs showed a mild rhabdomyolysis with CK levels around 800 which improved with fluids. Patient had extensive workup done including MRI brain all which were not showing anything acute. 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 Rheumatology consult was obtained and patient had steroids injected into the joints per the notes. Plan was to evaluate patient for possible rehabilitation placement when patient developed jaw dislocation. ENT consult was obtained in chart was reduced. But patient dislocated again and at this time Dr. [name] Splinter, Patrcia Dolly cone plastic surgeon was consulted and patient was transferred for further management to [place] under hospitalist. On my exam patient at this time is able to open the mouth wide and patient states his pain is resolved and is able to chew.  Review of Systems: As presented in the history of presenting illness, rest negative.  Past Medical History  Diagnosis Date  . Allergy   . Asthma   . Cancer     PROSTATE  . Colon polyps   . Diverticulitis   . GERD (gastroesophageal reflux disease)   . Sleep disturbance, unspecified   . Constipation    Past Surgical History  Procedure Laterality Date  . Prostate surgery    . Colon surgery  1998    COLONIC PERF/OSTOMY   Social History:  reports that he has quit smoking. His smoking use included Cigars. He has never used smokeless tobacco. His alcohol and  drug histories are not on file. Home. where does patient live-- Can do ADLs. Can patient participate in ADLs?  No Known Allergies  Family History  Problem Relation Age of Onset  . Heart disease Brother 70    HEART ATTACK      Prior to Admission medications   Medication Sig Start Date End Date Taking? Authorizing Provider  albuterol (VENTOLIN HFA) 108 (90 BASE) MCG/ACT inhaler Inhale 2 puffs into the lungs 3 (three) times daily as needed. 03/17/11  Yes Karie Schwalbe, MD  calcium carbonate (TUMS - DOSED IN MG ELEMENTAL CALCIUM) 500 MG chewable tablet Chew 1 tablet by mouth daily.   Yes Historical Provider, MD  fluticasone (FLONASE) 50 MCG/ACT nasal spray Place 1 spray into the nose as needed. 06/05/11  Yes Karie Schwalbe, MD  ibuprofen (ADVIL,MOTRIN) 200 MG tablet Take 400 mg by mouth 3 (three) times daily after meals.   Yes Historical Provider, MD  Menthol, Topical Analgesic, (ICY HOT EX) Apply 1 application topically daily as needed (knee pain).   Yes Historical Provider, MD  omeprazole (PRILOSEC) 20 MG capsule Take 20 mg by mouth daily.   Yes Historical Provider, MD  tiotropium (SPIRIVA HANDIHALER) 18 MCG inhalation capsule Place 1 capsule (18 mcg total) into inhaler and inhale daily. 10/31/11  Yes Karie Schwalbe, MD  traMADol (ULTRAM) 50 MG tablet Take 1 tablet (50 mg total) by mouth 3 (three) times daily as needed for pain. 05/25/12  Yes Karie Schwalbe, MD  traZODone (DESYREL) 50 MG tablet Take 1-2 tablets (50-100 mg total) by  mouth at bedtime. 05/25/12  Yes Karie Schwalbe, MD   Physical Exam: There were no vitals filed for this visit.   General:  Well-developed well-nourished.  Eyes: anicteric no pallor.  ENT: no discharge from ears eyes nose mouth.  Neck: no mass felt.  Cardiovascular: S1-S2 heard.  Respiratory: no rhonchi or crepitations.  Abdomen: soft nontender bowel sounds present.  Skin: no rash.  Musculoskeletal: patient has dressing on his  knees.  Psychiatric: appears normal.  Neurologic: alert and oriented to time place and person. Moves all extremities.  Labs on Admission:  Basic Metabolic Panel: No results found for this basename: NA, K, CL, CO2, GLUCOSE, BUN, CREATININE, CALCIUM, MG, PHOS,  in the last 168 hours Liver Function Tests: No results found for this basename: AST, ALT, ALKPHOS, BILITOT, PROT, ALBUMIN,  in the last 168 hours No results found for this basename: LIPASE, AMYLASE,  in the last 168 hours No results found for this basename: AMMONIA,  in the last 168 hours CBC: No results found for this basename: WBC, NEUTROABS, HGB, HCT, MCV, PLT,  in the last 168 hours Cardiac Enzymes: No results found for this basename: CKTOTAL, CKMB, CKMBINDEX, TROPONINI,  in the last 168 hours  BNP (last 3 results) No results found for this basename: PROBNP,  in the last 8760 hours CBG: No results found for this basename: GLUCAP,  in the last 168 hours  Radiological Exams on Admission: No results found.   Assessment/Plan Principal Problem:   Jaw dislocation Active Problems:   ASTHMA   Bilateral leg weakness   1. Jaw dislocation - at this time patient feels that his jaw dislocation probably resolved. Denies any pain and patient states that he is able to chew. I discussed with Dr. [name] Splinter, plastic surgeon. Dr. [name] Splinter advised to place patient on soft diet and to observe. If patient has repeat dislocation during hospitalization to call her back. Otherwise patient can be referred to outpatient maxillofacial surgeon. 2. Possible pseudogout - patient has had recent intra-articular steroid injections per the discharge summary. At this time physical therapy consult has been requested. Based on which we have further plans if patient is to go to rehabilition. 3. History of asthma - presently not wheezing. 4. Rhabdomyolysis - per the notes patient CK levels have come normal after hydration. Recheck CK. 5. History of prostate  cancer. 6. Chronic mild thrombocytopenia - patient's platelets counts around 138 and a hepatitis C virus was checked at the [facility] which was negative. Closely follow CBC.  Patient's labs are pending.    Code Status: full code.  Family Communication: none.  Disposition Plan: admit for observation.    Damesha Lawler N. [facility] Pager [phone].  If 7PM-7AM, please contact night-coverage [email or web address] Password [facility] 10/01/2012, 8:37 PM

## 2012-10-02 DIAGNOSIS — IMO0002 Reserved for concepts with insufficient information to code with codable children: Secondary | ICD-10-CM

## 2012-10-02 LAB — BASIC METABOLIC PANEL
BUN: 20 mg/dL (ref 6–23)
Chloride: 102 mEq/L (ref 96–112)
GFR calc Af Amer: >90 mL/min (ref >90)
GFR calc non Af Amer: >90 mL/min (ref >90)
Glucose, Bld: 117 mg/dL — ABNORMAL HIGH (ref 70–99)
Potassium: 3.4 mEq/L — ABNORMAL LOW (ref 3.5–5.1)
Sodium: 137 mEq/L (ref 135–145)

## 2012-10-02 LAB — CBC
HCT: 37.4 % — ABNORMAL LOW (ref 39.0–52.0)
Hemoglobin: 13.5 g/dL (ref 13.0–17.0)
MCH: 33.8 pg (ref 26.0–34.0)
MCHC: 36.1 g/dL — ABNORMAL HIGH (ref 30.0–36.0)
RBC: 3.99 MIL/uL — ABNORMAL LOW (ref 4.22–5.81)

## 2012-10-02 MED ORDER — LORAZEPAM 0.5 MG PO TABS
0.5000 mg | ORAL_TABLET | Freq: Four times a day (QID) | ORAL | Status: DC | PRN
  Administered 2012-10-02 – 2012-10-06 (×9): 0.5 mg via ORAL
  Filled 2012-10-02 (×10): qty 1

## 2012-10-02 NOTE — Progress Notes (Signed)
[facility] PROGRESS NOTE  [name] [name] UJW:[id number] DOB: Feb 06, 1934 DOA: 10/01/2012 PCP: [name] Abide, MD Brief narrative 78 y.o. male history prostate cancer status post prostatectomy had originally gone to [facility] because of weakness. Patient's labs showed a mild rhabdomyolysis with CK levels around 800 which improved with fluids. Patient had extensive workup done including MRI brain all which were not showing anything acute. Patient had aspiration of his knee joints and study  showed CPPD crystals. Joint aspirate showed WBC of 35k and neutrophils of 87% with CPPD crystals. Rheumatology consult was obtained and patient had steroids injected into the joints. Plan was to evaluate patient for possible rehabilitation placement when patient developed jaw dislocation. ENT consult was obtained in chart was reduced. But patient dislocated again and at this time Dr. [name] [name], [name] [name] cone plastic surgeon was consulted and patient was transferred for further management.  Assessment/ plan Jaw dislocation  Resolved at this time. On admission, Dr [name] [name] discussed with Dr. [name] [name], plastic surgeon. Dr. [name] [name] advised to place patient on soft diet and to observe. If patient has repeat dislocation during hospitalization to call her back. Otherwise patient can be referred to outpatient maxillofacial surgeon.  pseudogout recent intra-articular steroid injections per the discharge summary.  Denies further pain. PT eval ordered.   Rhabdomyolysis  - per the notes patient CK levels have come normal after hydration.  CK normal on admission   Hypokalemia  replenished  History of asthma  stable  History of prostate cancer   HPI/Subjective: Patient seen and examined. Denies any jaw pain, tolerating diet.   Objective: Filed Vitals:   10/02/12 0453  BP: 159/95  Pulse: 87  Temp: 98 F (36.7 C)  Resp: 17    Intake/Output Summary (Last 24 hours) at 10/02/12 1127 Last data  filed at 10/02/12 1100  Gross per 24 hour  Intake    540 ml  Output    350 ml  Net    190 ml   There were no vitals filed for this visit.  Exam:   General:  Elderly male in NAD  HEENT: no pallor, moist oral mucosa, oral cavity normal, no jaw tenderness and painful movement  Cardiovascular: NS1&S2, no murmurs, rubs or gallop  Abd: soft, NT, ND, BS+  Ext: warm, no edema  CNS: AAOX3    Data Reviewed: Basic Metabolic Panel:  Recent Labs Lab 10/01/12 2200 10/02/12 0612  NA 137 137  K 3.5 3.4*  CL 104 102  CO2 22 21  GLUCOSE 155* 117*  BUN 17 20  CREATININE 0.51 0.52  CALCIUM 8.5 8.8   Liver Function Tests: No results found for this basename: AST, ALT, ALKPHOS, BILITOT, PROT, ALBUMIN,  in the last 168 hours No results found for this basename: LIPASE, AMYLASE,  in the last 168 hours No results found for this basename: AMMONIA,  in the last 168 hours CBC:  Recent Labs Lab 10/01/12 2200 10/02/12 0612  WBC 3.5* 2.6*  NEUTROABS 2.4  --   HGB 13.1 13.5  HCT 35.5* 37.4*  MCV 92.7 93.7  PLT 213 205   Cardiac Enzymes:  Recent Labs Lab 10/01/12 2200  CKTOTAL 53   BNP (last 3 results) No results found for this basename: PROBNP,  in the last 8760 hours CBG: No results found for this basename: GLUCAP,  in the last 168 hours  No results found for this or any previous visit (from the past 240 hour(s)).   Studies: No results found.  Scheduled Meds: .  calcium carbonate  1 tablet Oral Daily  . fluticasone  1 spray Each Nare Daily  . pantoprazole  40 mg Oral Daily  . tiotropium  18 mcg Inhalation Daily  . traZODone  50-100 mg Oral QHS   Continuous Infusions: . sodium chloride         Time spent: 25 minutes    [name] [name]  [facility] Pager [phone] If 7PM-7AM, please contact night-coverage at [email or web address], password [facility] 10/02/2012, 11:27 AM  LOS: 1 day

## 2012-10-02 NOTE — Evaluation (Signed)
Physical Therapy Evaluation Patient Details Name: [name] [name] MRN: [id number] DOB: 12-04-1934 Today's Date: 10/02/2012 Time: 0840-0903 PT Time Calculation (min): 23 min  PT Assessment / Plan / Recommendation History of Present Illness  Pt originally went to ED at [facility] for weakness and bilat knee pain.  He then developed a jaw dislocation and was transferred to [facility] for further treatment.  Clinical Impression  Pt admitted with jaw dislocation.   His original reason for going to the ED was weakness and knee pain.  Pt currently with functional limitations due to the deficits listed below (see PT Problem List). Pt prior mobility status and home status difficult to obtain due to communication issues from the jaw dislocation.  Pt will benefit from skilled PT to increase their independence and safety with mobility to allow discharge to the venue listed below.       PT Assessment  Patient needs continued PT services    Follow Up Recommendations  SNF    Does the patient have the potential to tolerate intense rehabilitation      Barriers to Discharge        Equipment Recommendations  None recommended by PT    Recommendations for Other Services     Frequency Min 3X/week    Precautions / Restrictions Precautions Precautions: Fall   Pertinent Vitals/Pain 0/10      Mobility  Bed Mobility Bed Mobility: Supine to Sit Supine to Sit: 4: Min guard;With rails Transfers Transfers: Sit to Stand;Stand to Sit Sit to Stand: 3: Mod assist;From bed;With upper extremity assist Stand to Sit: 4: Min assist;To chair/3-in-1;With armrests Details for Transfer Assistance: verbal cues for hand placement, safety Ambulation/Gait Ambulation/Gait Assistance: 3: Mod assist Ambulation Distance (Feet): 5 Feet Assistive device: Rolling walker Ambulation/Gait Assistance Details: verbal cues for sequencing Gait Pattern: Shuffle;Decreased stride length Gait velocity: decreased     Exercises     PT Diagnosis: Difficulty walking;Generalized weakness  PT Problem List: Decreased strength;Decreased activity tolerance;Decreased mobility;Decreased safety awareness PT Treatment Interventions: DME instruction;Gait training;Stair training;Functional mobility training;Therapeutic activities;Therapeutic exercise;Patient/family education     PT Goals(Current goals can be found in the care plan section) Acute Rehab PT Goals Patient Stated Goal: unable to state PT Goal Formulation: With patient Time For Goal Achievement: 10/16/12 Potential to Achieve Goals: Good  Visit Information  Last PT Received On: 10/02/12 Assistance Needed: +2 (for ambulation) History of Present Illness: Pt originally went to ED at [facility] for weakness and bilat knee pain.  He then developed a jaw dislocation and was transferred to [facility] for further treatment.       Prior Functioning  Home Living Family/patient expects to be discharged to:: Unsure Additional Comments: Unable to obtain full history due to difficult communication with jaw dislocation. Prior Function Level of Independence: Independent with assistive device(s) Communication Communication: Other (comment) (jaw dislocation)    Cognition  Cognition Arousal/Alertness: Awake/alert Behavior During Therapy: WFL for tasks assessed/performed Overall Cognitive Status: Difficult to assess Difficult to assess due to: Impaired communication    Extremity/Trunk Assessment Upper Extremity Assessment Upper Extremity Assessment: Defer to OT evaluation Lower Extremity Assessment Lower Extremity Assessment: Generalized weakness   Balance    End of Session PT - End of Session Equipment Utilized During Treatment: Gait belt Activity Tolerance: Patient tolerated treatment well Patient left: in chair;with call bell/phone within reach Nurse Communication: Mobility status  GP     Ilda Foil 10/02/2012, 11:43 AM  Aida Raider, PT  Office # [phone]  Pager [phone]

## 2012-10-02 NOTE — Progress Notes (Signed)
UR Completed.  Zacharia, Dannie Hattabaugh Jane [phone] 10/02/2012  

## 2012-10-03 DIAGNOSIS — M1710 Unilateral primary osteoarthritis, unspecified knee: Secondary | ICD-10-CM

## 2012-10-03 LAB — GLUCOSE, CAPILLARY: Glucose-Capillary: 121 mg/dL — ABNORMAL HIGH (ref 70–99)

## 2012-10-03 MED ORDER — HYDRALAZINE HCL 20 MG/ML IJ SOLN: 10.0000 mg | Freq: Four times a day (QID) | INTRAMUSCULAR | Status: DC | PRN

## 2012-10-03 MED ORDER — HYDRALAZINE HCL 25 MG PO TABS
25.0000 mg | ORAL_TABLET | Freq: Four times a day (QID) | ORAL | Status: DC | PRN
  Administered 2012-10-05: 25 mg via ORAL
  Filled 2012-10-03 (×2): qty 1

## 2012-10-03 NOTE — Progress Notes (Signed)
[facility] PROGRESS NOTE  CORDAY WYKA UJW:[id number] DOB: 03-26-1934 DOA: 10/01/2012 PCP: Tillman Abide, MD  Brief narrative  78 y.o. male history prostate cancer status post prostatectomy had originally gone to [facility] because of weakness. Patient's labs showed a mild rhabdomyolysis with CK levels around 800 which improved with fluids. Patient had extensive workup done including MRI brain all which were not showing anything acute. Patient had aspiration of his knee joints and study showed CPPD crystals. Joint aspirate showed WBC of 35k and neutrophils of 87% with CPPD crystals. Rheumatology consult was obtained and patient had steroids injected into the joints. Plan was to evaluate patient for possible rehabilitation placement when patient developed jaw dislocation. ENT consult was obtained in chart was reduced. But patient dislocated again and at this time Dr. [name] Splinter, [facility] plastic surgeon was consulted and patient was transferred for further management.   Assessment/ plan  Jaw dislocation  Resolved at this time. On admission, Dr Toniann Fail discussed with Dr. [name] Splinter, plastic surgeon. Dr. [name] Splinter advised to place patient on soft diet and to observe.if stable can be referred to outpatient maxillofacial surgeon.   pseudogout  recent intra-articular steroid injections per the discharge summary.  Denies further pain. PT eval ordered.   Rhabdomyolysis  - per the notes patient CK levels have come normal after hydration.  CK normal on admission   Hypokalemia  replenished   History of asthma  stable   History of prostate cancer  Code status: full code Family communication: none Discharge plan: seen by PT. Recommends SNF. SW informed.  HPI/Subjective: No overnight issues  Objective: Filed Vitals:   10/03/12 0621  BP: 177/93  Pulse: 82  Temp: 97.4 F (36.3 C)  Resp: 16   No intake or output data in the 24 hours ending 10/03/12 1152 There were no  vitals filed for this visit.  Exam: General: Elderly male in NAD  HEENT: no pallor, moist oral mucosa, oral cavity normal, no jaw tenderness or painful movement  Cardiovascular: NS1&S2, no murmurs, rubs or gallop  Abd: soft, NT, ND, BS+  Ext: warm, no edema  CNS: AAOX3   Data Reviewed: Basic Metabolic Panel:  Recent Labs Lab 10/01/12 2200 10/02/12 0612  NA 137 137  K 3.5 3.4*  CL 104 102  CO2 22 21  GLUCOSE 155* 117*  BUN 17 20  CREATININE 0.51 0.52  CALCIUM 8.5 8.8   Liver Function Tests: No results found for this basename: AST, ALT, ALKPHOS, BILITOT, PROT, ALBUMIN,  in the last 168 hours No results found for this basename: LIPASE, AMYLASE,  in the last 168 hours No results found for this basename: AMMONIA,  in the last 168 hours CBC:  Recent Labs Lab 10/01/12 2200 10/02/12 0612  WBC 3.5* 2.6*  NEUTROABS 2.4  --   HGB 13.1 13.5  HCT 35.5* 37.4*  MCV 92.7 93.7  PLT 213 205   Cardiac Enzymes:  Recent Labs Lab 10/01/12 2200  CKTOTAL 53   BNP (last 3 results) No results found for this basename: PROBNP,  in the last 8760 hours CBG: No results found for this basename: GLUCAP,  in the last 168 hours  No results found for this or any previous visit (from the past 240 hour(s)).   Studies: No results found.  Scheduled Meds: . calcium carbonate  1 tablet Oral Daily  . fluticasone  1 spray Each Nare Daily  . pantoprazole  40 mg Oral Daily  . tiotropium  18 mcg Inhalation Daily  .  traZODone  50-100 mg Oral QHS   Continuous Infusions:     Time spent: 25 minutes    Nihal Marzella  [facility] Pager [phone]. If 7PM-7AM, please contact night-coverage at [email or web address], password [facility] 10/03/2012, 11:52 AM  LOS: 2 days

## 2012-10-04 DIAGNOSIS — I10 Essential (primary) hypertension: Secondary | ICD-10-CM | POA: Diagnosis present

## 2012-10-04 MED ORDER — AMLODIPINE BESYLATE 5 MG PO TABS
5.0000 mg | ORAL_TABLET | Freq: Every day | ORAL | Status: DC
  Administered 2012-10-04 – 2012-10-05 (×2): 5 mg via ORAL
  Filled 2012-10-04 (×2): qty 1

## 2012-10-04 NOTE — Progress Notes (Signed)
Physical Therapy Treatment Patient Details Name: [name] [name] MRN: [id number] DOB: 04/22/1934 Today's Date: 10/04/2012 Time: 6213-0865 PT Time Calculation (min): 15 min  PT Assessment / Plan / Recommendation  History of Present Illness Pt originally went to ED at [facility] for weakness and bilat knee pain.  He then developed a jaw dislocation and was transferred to [facility] for further treatment.   PT Comments   Patient initially frustrated when being encouraged to ambulate once up. Patient "freezing" throughout and needing cues to start moving LEs again. Patient able to take some small steps to chair but unable to ambulate further. Patient not motivated to progress with ambulation at this time. Becoming frustrated with attempts to move more  Follow Up Recommendations  SNF     Does the patient have the potential to tolerate intense rehabilitation     Barriers to Discharge        Equipment Recommendations  None recommended by PT    Recommendations for Other Services    Frequency Min 3X/week   Progress towards PT Goals Progress towards PT goals: Progressing toward goals  Plan Current plan remains appropriate    Precautions / Restrictions Precautions Precautions: Fall   Pertinent Vitals/Pain Did not rate pain and stated knees felt "ok". patient repositioned for comfort     Mobility  Bed Mobility Bed Mobility: Sitting - Scoot to Edge of Bed Supine to Sit: 4: Min assist Sitting - Scoot to Edge of Bed: 3: Mod assist Details for Bed Mobility Assistance: A for BLE and for trunk support. Use of chuck pad to scoot patient to EOB Transfers Sit to Stand: 3: Mod assist;From bed;With upper extremity assist Stand to Sit: 4: Min assist;To chair/3-in-1;With armrests Details for Transfer Assistance: verbal cues for hand placement, safety. A for balance and support as patient with posterior lean in standing and bracing legs agaisnt bed for support Ambulation/Gait Ambulation/Gait  Assistance: 3: Mod assist Ambulation Distance (Feet): 5 Feet Assistive device: Rolling walker Ambulation/Gait Assistance Details: Patient with shuffling steps, unable to take large steps or work on a gait pattern. Patient with heavy posterior lean. +1 extra for safety Gait Pattern: Shuffle;Decreased stride length Gait velocity: decreased    Exercises     PT Diagnosis:    PT Problem List:   PT Treatment Interventions:     PT Goals (current goals can now be found in the care plan section)    Visit Information  Last PT Received On: 10/04/12 History of Present Illness: Pt originally went to ED at [facility] for weakness and bilat knee pain.  He then developed a jaw dislocation and was transferred to [facility] for further treatment.    Subjective Data      Cognition  Cognition Arousal/Alertness: Awake/alert Behavior During Therapy: WFL for tasks assessed/performed Overall Cognitive Status: Difficult to assess    Balance     End of Session PT - End of Session Equipment Utilized During Treatment: Gait belt Activity Tolerance: Patient limited by fatigue Patient left: in chair;with call bell/phone within reach Nurse Communication: Mobility status   GP     Fredrich Birks 10/04/2012, 10:19 AM 10/04/2012 Fredrich Birks PTA [phone] pager [phone] office

## 2012-10-04 NOTE — Progress Notes (Signed)
[facility] PROGRESS NOTE  [name] JU WUJ:[id number] DOB: [birth year] DOA: 10/01/2012 PCP: Tillman Abide, MD  Brief narrative  78 y.o. male history prostate cancer status post prostatectomy had originally gone to [facility] because of weakness. Patient's labs showed a mild rhabdomyolysis with CK levels around 800 which improved with fluids. Patient had extensive workup done including MRI brain all which were not showing anything acute. Patient had aspiration of his knee joints and study showed CPPD crystals. Joint aspirate showed WBC of 35k and neutrophils of 87% with CPPD crystals. Rheumatology consult was obtained and patient had steroids injected into the joints. Plan was to evaluate patient for possible rehabilitation placement when patient developed jaw dislocation. ENT consult was obtained in chart was reduced. But patient dislocated again and at this time Dr. [name] Splinter, [facility] plastic surgeon was consulted and patient was transferred for further management.   Assessment/ plan  Jaw dislocation  Resolved at this time. On admission, Dr Toniann Fail discussed with Dr. [name] Splinter, plastic surgeon. Dr. [name] Splinter advised to place patient on soft diet and to observe.if stable can be referred to outpatient maxillofacial surgeon.   pseudogout  recent intra-articular steroid injections per the discharge summary.  Denies further pain. PT eval recommends skilled nursing facility.  Elevated blood pressure No history of hypertension. Noted for elevated blood pressure. Started on when necessary hydralazine and low-dose amlodipine.  Rhabdomyolysis - per the notes patient CK levels have come normal after hydration.  CK normal on admission   Hypokalemia  replenished   History of asthma  stable   History of prostate cancer   Code status: full code  Family communication: none  Discharge plan: seen by PT. Recommends SNF. Likely discharge tomorrow   HPI/Subjective: Patient noted  to have elevated blood pressure. Denies any symptoms  Objective: Filed Vitals:   10/04/12 0530  BP: 177/97  Pulse: 81  Temp: 98.8 F (37.1 C)  Resp: 18    Intake/Output Summary (Last 24 hours) at 10/04/12 1108 Last data filed at 10/03/12 1744  Gross per 24 hour  Intake    360 ml  Output    900 ml  Net   -540 ml   There were no vitals filed for this visit.  Exam: General: Elderly male in NAD  HEENT: no pallor, moist oral mucosa, oral cavity normal, no jaw tenderness or painful movement. Cardiovascular: NS1&S2, no murmurs, rubs or gallop  Abd: soft, NT, ND, BS+  Ext: warm, no edema  CNS: AAOX3  Data Reviewed: Basic Metabolic Panel:  Recent Labs Lab 10/01/12 2200 10/02/12 0612  NA 137 137  K 3.5 3.4*  CL 104 102  CO2 22 21  GLUCOSE 155* 117*  BUN 17 20  CREATININE 0.51 0.52  CALCIUM 8.5 8.8   Liver Function Tests: No results found for this basename: AST, ALT, ALKPHOS, BILITOT, PROT, ALBUMIN,  in the last 168 hours No results found for this basename: LIPASE, AMYLASE,  in the last 168 hours No results found for this basename: AMMONIA,  in the last 168 hours CBC:  Recent Labs Lab 10/01/12 2200 10/02/12 0612  WBC 3.5* 2.6*  NEUTROABS 2.4  --   HGB 13.1 13.5  HCT 35.5* 37.4*  MCV 92.7 93.7  PLT 213 205   Cardiac Enzymes:  Recent Labs Lab 10/01/12 2200  CKTOTAL 53   BNP (last 3 results) No results found for this basename: PROBNP,  in the last 8760 hours CBG:  Recent Labs Lab 10/03/12 2307  GLUCAP  121*    No results found for this or any previous visit (from the past 240 hour(s)).   Studies: No results found.  Scheduled Meds: . amLODipine  5 mg Oral Daily  . calcium carbonate  1 tablet Oral Daily  . fluticasone  1 spray Each Nare Daily  . pantoprazole  40 mg Oral Daily  . tiotropium  18 mcg Inhalation Daily  . traZODone  50-100 mg Oral QHS   Continuous Infusions:     Time spent: 25 MINUTES    Emileigh Kellett  [facility] Pager [phone]. If 7PM-7AM, please contact night-coverage at [email or web address], password [facility] 10/04/2012, 11:08 AM  LOS: 3 days

## 2012-10-05 ENCOUNTER — Encounter: Payer: Self-pay | Admitting: Internal Medicine

## 2012-10-05 DIAGNOSIS — J449 Chronic obstructive pulmonary disease, unspecified: Secondary | ICD-10-CM

## 2012-10-05 DIAGNOSIS — R41841 Cognitive communication deficit: Secondary | ICD-10-CM

## 2012-10-05 MED ORDER — HYDRALAZINE HCL 50 MG PO TABS
50.0000 mg | ORAL_TABLET | Freq: Three times a day (TID) | ORAL | Status: DC
  Administered 2012-10-05 – 2012-10-06 (×3): 50 mg via ORAL
  Filled 2012-10-05 (×6): qty 1

## 2012-10-05 MED ORDER — AMLODIPINE BESYLATE 10 MG PO TABS
10.0000 mg | ORAL_TABLET | Freq: Every day | ORAL | Status: DC
  Administered 2012-10-06: 10 mg via ORAL
  Filled 2012-10-05: qty 1

## 2012-10-05 NOTE — Consult Note (Signed)
Reason for Consult: Mandibular dislocation Referring Physician: Eddie North, MD  HPI:  [name] [name] is a 78 y.o. male who was transferred from [facility] to [facility] last week for treatment of his mandibular dislocation. Apparently the patient underwent reduction of his mandibular dislocation last week in [place]. However he experienced recurrent dislocation of his mandible while at [facility]. The patient complains of bilateral jaw pain this morning. He has difficulty closing his mouth. The patient also has a history of rhabdomyolysis and prostate cancer.  Past Medical History  Diagnosis Date  . Allergy   . Asthma   . Cancer     PROSTATE  . Colon polyps   . Diverticulitis   . GERD (gastroesophageal reflux disease)   . Sleep disturbance, unspecified   . Constipation     Past Surgical History  Procedure Laterality Date  . Prostate surgery    . Colon surgery  1998    COLONIC PERF/OSTOMY    Family History  Problem Relation Age of Onset  . Heart disease Brother 35    HEART ATTACK    Social History:  reports that he has quit smoking. His smoking use included Cigars. He has never used smokeless tobacco. His alcohol and drug histories are not on file.  Allergies: No Known Allergies  Medications:  I have reviewed the patient's current medications. Scheduled: . amLODipine  5 mg Oral Daily  . calcium carbonate  1 tablet Oral Daily  . fluticasone  1 spray Each Nare Daily  . pantoprazole  40 mg Oral Daily  . tiotropium  18 mcg Inhalation Daily  . traZODone  50-100 mg Oral QHS   MVH:QIONGEXBMWUXL, acetaminophen, albuterol, hydrALAZINE, LORazepam, ondansetron (ZOFRAN) IV, ondansetron, traMADol  Results for orders placed during the hospital encounter of 10/01/12 (from the past 48 hour(s))  GLUCOSE, CAPILLARY     Status: Abnormal   Collection Time    10/03/12 11:07 PM      Result Value Range   Glucose-Capillary 121 (*) 70 - 99 mg/dL    No results  found.  Review of systems As presented in the history of presenting illness and PMH, rest negative.  Blood pressure 170/86, pulse 82, temperature 97.9 F (36.6 C), temperature source Oral, resp. rate 15, SpO2 97.00%.  Physical Exam: General: Elderly male in no acute distress.  Eyes: anicteric no pallor, extraocular motion is intact. ENT: no discharge from ears eyes nose mouth.  ENT:  Examination of the ears shows normal auricles and external auditory canals bilaterally. Both tympanic membranes are intact. Nasal examination shows normal mucosa, septum, turbinates. Facial examination shows no asymmetry. Palpation of the face elicit no significant tenderness. Oral cavity examination shows significant trismus. The patient is unable to close his mouth. Palpation of the neck reveals no lymphadenopathy or mass. The trachea is midline.  Respiratory: no rhonchi or crepitations.  Abdomen: soft nontender bowel sounds present.  Skin: no rash.  Musculoskeletal: patient has dressing on his knees.  Psychiatric: appears normal.  Neurologic: alert and oriented to time place and person. Moves all extremities.  Procedure: Closed reduction of bilateral mandibular dislocation. Anesthesia: None Description: The patient is placed upright in the hospital bed. Manual pressure is applied to his mandible inferiorly and posteriorly. The dislocated mandible was carefully reduced. After the reduction procedure, the patient is able to move his mandible freely without restriction. The patient tolerated the procedure well.   Assessment/Plan:  Recurrent mandibular dislocation. The dislocation is reduced manually without difficulty. Recommend placing  the patient on soft mechanical diet to reduce the risks of another recurrent dislocation. The patient should followup with an oral maxillofacial surgeon for possible long-term therapy.  Sebastian Dzik,SUI W 10/05/2012, 9:59 AM

## 2012-10-05 NOTE — Progress Notes (Signed)
Patient resting better, but now complaining of jaw pain pointing to both sides.  Tramadol 50mg  given.  Also, BP elevated at 183/78 and PRN Hydralazine 25mg  given per parameters.  No further needs at this time.  Continue to monitor.

## 2012-10-05 NOTE — Plan of Care (Signed)
Problem: Phase II Progression Outcomes Goal: Vital signs remain stable Outcome: Not Met (add Reason) BP consistently elevated SBP 170-190's and DBP 80-90's. On Norvasc 5mg  daily and Apresoline PRN

## 2012-10-05 NOTE — Plan of Care (Signed)
Problem: Phase II Progression Outcomes Goal: Progress activity as tolerated unless otherwise ordered Outcome: Not Progressing Needs SNF; requires 2+ assist for ambulation with walker; balance unsteady, leans backwards; does not follow cues

## 2012-10-05 NOTE — Progress Notes (Signed)
[facility] PROGRESS NOTE  DAVIONE LENKER YQM:[id number] DOB: January 07, 1935 DOA: 10/01/2012 PCP: Tillman Abide, MD  Brief narrative  78 y.o. male history prostate cancer status post prostatectomy had originally gone to [facility] because of weakness. Patient's labs showed a mild rhabdomyolysis with CK levels around 800 which improved with fluids. Patient had extensive workup done including MRI brain all which were not showing anything acute. Patient had aspiration of his knee joints and study showed CPPD crystals. Joint aspirate showed WBC of 35k and neutrophils of 87% with CPPD crystals. Rheumatology consult was obtained and patient had steroids injected into the joints. Plan was to evaluate patient for possible rehabilitation placement when patient developed jaw dislocation. ENT consult was obtained in chart was reduced. But patient dislocated again and at this time Dr. [name] Splinter, [facility] plastic surgeon was consulted and patient was transferred for further management.   Assessment/ plan  Jaw dislocation  Resolved at this time. On admission, Dr Toniann Fail discussed with Dr. [name] Splinter, plastic surgeon. Dr. [name] Splinter advised to place patient on soft diet and to observe.if stable can be referred to outpatient maxillofacial surgeon. Patient again had an acute jaw dislocation this morning. appreciate ENT Dr Suszanne Conners evaluation who reduced the mandibular dislocation at bedside. Patient felt better after that. I have scheduled him appointment with maxillofacial surgeon Dr. Graylon Gunning for 9/15 at 4:15 pm.  -continue with dysphagia diet to avoid recurrent dislocations.  pseudogout  recent intra-articular steroid injections per the discharge summary.  Denies further pain. PT eval recommends skilled nursing facility.  Elevated blood pressure  No history of hypertension. Noted for elevated blood pressure. Started on when necessary hydralazine and low-dose amlodipine. I will increase the dose of  amlodipine and place him on scheduled hydralazine.  Rhabdomyolysis  - per the notes patient CK levels have come normal after hydration.  CK normal on admission   Hypokalemia  replenished   History of asthma  stable   History of prostate cancer  Code status: full code    Family communication: none . Family refused to be contacted. Now awaiting psych eval to assess capacity as patient wishes to go home and not to Rehab.  Discharge plan: seen by PT. Recommends SNF. Patient wishes to go home. awaiting psych to assess capacity.  HPI/Subjective:  Patient had jaw dislocation this morning. ENT surgeon consulted who came by and reduced the TMJ. Patient felt better after that.   Objective: Filed Vitals:   10/05/12 1430  BP: 172/89  Pulse: 106  Temp: 97.4 F (36.3 C)  Resp: 17    Intake/Output Summary (Last 24 hours) at 10/05/12 1610 Last data filed at 10/05/12 1225  Gross per 24 hour  Intake    720 ml  Output      0 ml  Net    720 ml   There were no vitals filed for this visit.  Exam: General: Elderly male in NAD  HEENT: no pallor, moist oral mucosa, oral cavity normal, no jaw tenderness or painful movement.  Cardiovascular: NS1&S2, no murmurs, rubs or gallop  Abd: soft, NT, ND, BS+  Ext: warm, no edema  CNS: AAOX3   Data Reviewed: Basic Metabolic Panel:  Recent Labs Lab 10/01/12 2200 10/02/12 0612  NA 137 137  K 3.5 3.4*  CL 104 102  CO2 22 21  GLUCOSE 155* 117*  BUN 17 20  CREATININE 0.51 0.52  CALCIUM 8.5 8.8   Liver Function Tests: No results found for this basename: AST, ALT,  ALKPHOS, BILITOT, PROT, ALBUMIN,  in the last 168 hours No results found for this basename: LIPASE, AMYLASE,  in the last 168 hours No results found for this basename: AMMONIA,  in the last 168 hours CBC:  Recent Labs Lab 10/01/12 2200 10/02/12 0612  WBC 3.5* 2.6*  NEUTROABS 2.4  --   HGB 13.1 13.5  HCT 35.5* 37.4*  MCV 92.7 93.7  PLT 213 205   Cardiac  Enzymes:  Recent Labs Lab 10/01/12 2200  CKTOTAL 53   BNP (last 3 results) No results found for this basename: PROBNP,  in the last 8760 hours CBG:  Recent Labs Lab 10/03/12 2307  GLUCAP 121*    No results found for this or any previous visit (from the past 240 hour(s)).   Studies: No results found.  Scheduled Meds: . amLODipine  5 mg Oral Daily  . calcium carbonate  1 tablet Oral Daily  . fluticasone  1 spray Each Nare Daily  . pantoprazole  40 mg Oral Daily  . tiotropium  18 mcg Inhalation Daily  . traZODone  50-100 mg Oral QHS   Continuous Infusions:     Time spent: 25 minutes    Zachrey Deutscher  [facility] Pager [phone]. If 7PM-7AM, please contact night-coverage at [email or web address], password [facility] 10/05/2012, 4:10 PM  LOS: 4 days

## 2012-10-05 NOTE — Progress Notes (Signed)
Patient sleeping quietly at this time.  Earlier in shift, he was restless trying to get OOB or out of chair and agitated grabbing and swatting at nurse during assessment. Speech was slurred and difficult to understand, causing patient to become frustrated. Received scheduled trazodone and PRN ativan at HS. He sat in recliner from 8-10pm.  Took meds whole in applesauce and drinks water without incident.   He has had 2 episodes of urinary incontinence.  No skin breakdown noted.  Bed low and locked with bed alarm in place.  Door open with frequent observation.

## 2012-10-05 NOTE — Consult Note (Signed)
Reason for Consult: Capacity evaluation Referring Physician: Eddie North, MD  [name] [name] is an 78 y.o. male.  HPI: Patient is seen and chart reviewed. Patient stated that he does not know about his health problems and required treatment. He stated "I don't know most of the questions related to his health questions. He does know his name, age, city he lives and his wife being diagnosed with dementia, identified objects showed, know the name of the current president. Patient stated that he has been forgetful lately. Review of his medical chart indicated several medical problems beside leg weakness. Spoke with his Child psychotherapist who states that he has been mean to his wife and gets agitated at daily evening, signs of early dementia, and his step daughter does not want to be responsible for his care and he does not have any one can care for him at his home.   MSE: Appeared lying on his bed, calm, quiet and cooperative and answer questions appropriately except health problems and health care needs. He has sad and depressed mood and constricted affect. He has normal speech but low volume. He has no SI/HI or evidence of psychosis.   Past Medical History  Diagnosis Date  . Allergy   . Asthma   . Cancer     PROSTATE  . Colon polyps   . Diverticulitis   . GERD (gastroesophageal reflux disease)   . Sleep disturbance, unspecified   . Constipation   . Pseudogout     Past Surgical History  Procedure Laterality Date  . Prostate surgery    . Colon surgery  1998    COLONIC PERF/OSTOMY    Family History  Problem Relation Age of Onset  . Heart disease Brother 95    HEART ATTACK    Social History:  reports that he has quit smoking. His smoking use included Cigars. He has never used smokeless tobacco. His alcohol and drug histories are not on file.  Allergies: No Known Allergies  Medications: I have reviewed the patient's current medications.  Results for orders placed during the hospital  encounter of 10/01/12 (from the past 48 hour(s))  GLUCOSE, CAPILLARY     Status: Abnormal   Collection Time    10/03/12 11:07 PM      Result Value Range   Glucose-Capillary 121 (*) 70 - 99 mg/dL    No results found.  Positive for memory deficits Blood pressure 172/89, pulse 106, temperature 97.4 F (36.3 C), temperature source Oral, resp. rate 17, SpO2 98.00%.   Assessment/Plan: Cognitive deficit, age appropriate R/O early stage dementia  Recommendation: Patient does not meet criteria for Capacity to make his own medical decisions and living arrangement based on my evaluation.  Recommend to obtain legal guardian and than SNF with rehab services will be  Appropriate placement. Appreciate psych consult and will sign off at this time.   Nehemiah Settle., MD 10/05/2012, 4:46 PM

## 2012-10-06 ENCOUNTER — Encounter (HOSPITAL_COMMUNITY): Payer: Self-pay | Admitting: General Practice

## 2012-10-06 DIAGNOSIS — M25569 Pain in unspecified knee: Secondary | ICD-10-CM

## 2012-10-06 LAB — BASIC METABOLIC PANEL
BUN: 14 mg/dL (ref 6–23)
Calcium: 8.7 mg/dL (ref 8.4–10.5)
Creatinine, Ser: 0.65 mg/dL (ref 0.50–1.35)
GFR calc Af Amer: >90 mL/min (ref >90)

## 2012-10-06 MED ORDER — LORAZEPAM 0.5 MG PO TABS: 0.5000 mg | ORAL_TABLET | Freq: Four times a day (QID) | ORAL | Status: AC | PRN

## 2012-10-06 MED ORDER — AMLODIPINE BESYLATE 10 MG PO TABS: 10.0000 mg | ORAL_TABLET | Freq: Every day | ORAL | Status: AC

## 2012-10-06 MED ORDER — HYDRALAZINE HCL 50 MG PO TABS: 50.0000 mg | ORAL_TABLET | Freq: Three times a day (TID) | ORAL | Status: AC

## 2012-10-06 NOTE — Clinical Social Work Psychosocial (Signed)
Clinical Social Work Department  BRIEF PSYCHOSOCIAL ASSESSMENT  Patient: [name] [name]  Account Number: [id number]   Admit date: 10/01/12 Clinical Social Worker Sabino Niemann, MSW Date/Time: 10/05/12  Referred by: Physician Date Referred:  Referred for   SNF Placement   Other Referral:  Interview type: Patient and patient's step daughter Other interview type: PSYCHOSOCIAL DATA  Living Status:spouse Admitted from facility:  Level of care:  Primary support name: [name] [name] Primary support relationship to patient: Wife Degree of support available:  Poor  CURRENT CONCERNS  Current Concerns   Post-Acute Placement   Other Concerns:  SOCIAL WORK ASSESSMENT / PLAN  CSW met with pt re: PT recommendation for SNF.   Pt lives with his spouse and gets help from his step-daughter  CSW explained placement process and answered questions.   Pt reports no preference  At this time   CSW completed FL2 and initiated SNF search.     Assessment/plan status: Information/Referral to [facility]  Other assessment/ plan:  Information/referral to community resources:  SNF   PTAR  PATIENT'S/FAMILY'S RESPONSE TO PLAN OF CARE:  Pt  reports he is agreeable to ST SNF in order to increase strength and independence with mobility prior to returning home  Pt verbalized understanding of placement process and appreciation for CSW assist.   Sabino Niemann, MSW [phone]

## 2012-10-06 NOTE — Clinical Social Work Psych Note (Signed)
Psych CSW reviewed chart and pt with psychiatrist.  Psychiatrist has documented "Patient does not meet criteria for Capacity to make his own medical decisions and living arrangement based on my evaluation."  Pysch CSW informed unit CSW.  Psych CSW is signing off.    Vickii Penna, LCSWA [phone]  Clinical Social Work

## 2012-10-06 NOTE — Discharge Summary (Signed)
Physician Discharge Summary  [name]:[id number] DOB: 02-Apr-1934 DOA: 10/01/2012  PCP: Tillman Abide, MD  Admit date: 10/01/2012 Discharge date: 10/06/2012  Time spent: 35 minutes  Recommendations for Outpatient Follow-up:   scheduled appointment with maxillofacial surgeon Dr. Graylon Gunning for 9/15 at 4:15 pm.  Adjust BP medication as needed.   Discharge Diagnoses:    Jaw dislocation   Pseudogout.    ASTHMA   Bilateral leg weakness   Hypertension   Discharge Condition: Stable  Diet recommendation: Heart Healthy.   There were no vitals filed for this visit.  History of present illness:  [name] [name] is a 78 y.o. male history prostate cancer status post prostatectomy had originally gone to [facility] because of weakness. Patient's labs showed a mild rhabdomyolysis with CK levels around 800 which improved with fluids. Patient had extensive workup done including MRI brain all which were not showing anything acute. 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 Rheumatology consult was obtained and patient had steroids injected into the joints per the notes. Plan was to evaluate patient for possible rehabilitation placement when patient developed jaw dislocation. ENT consult was obtained in chart was reduced. But patient dislocated again and at this time Dr. [name] Splinter, [facility] plastic surgeon was consulted and patient was transferred for further management to [place] under hospitalist. On my exam patient at this time is able to open the mouth wide and patient states his pain is resolved and is able to chew.   Hospital Course:  1-Jaw dislocation  Resolved at this time. On admission, Dr Toniann Fail discussed with Dr. [name] Splinter, plastic surgeon. Dr. [name] Splinter advised to place patient on soft diet and to observe if stable can be referred to outpatient  maxillofacial surgeon.  Patient again had an acute jaw dislocation 9-2. appreciate ENT Dr Suszanne Conners evaluation who reduced the mandibular dislocation at bedside. Patient felt better after that.Dr Windy Canny scheduled appointment with maxillofacial surgeon Dr. Graylon Gunning for 9/15 at 4:15 pm.  -continue with dysphagia diet to avoid recurrent dislocations.  2-pseudogout  recent intra-articular steroid injections per the discharge summary.  Denies further pain. PT eval recommends skilled nursing facility.  3-HTN:  Continue with Norvasc and Hydralazine.  4-Rhabdomyolysis  - per the notes patient CK levels have come normal after hydration.  CK normal on admission  5-Hypokalemia  Resolved. 6-History of asthma  stable  7-History of prostate cancer  Code status: full code  Psych recommend SNF. Patient does not have capacity.  Patient agree to go to SNF.   Procedures: Mandibular dislocation reduced manually 9-3   Consultations: Dr Suszanne Conners Dr Elsie Saas   Discharge Exam: Filed Vitals:   10/06/12 0555  BP: 155/74  Pulse: 84  Temp: 97.4 F (36.3 C)  Resp: 16    General: no distress.  Cardiovascular: S 1, S 2 RRR Respiratory: CTA  Discharge Instructions  Discharge Orders   Future Orders Complete By Expires   Diet - low sodium heart healthy  As directed    Increase activity slowly  As directed        Medication List    STOP taking these medications       ibuprofen 200 MG tablet  Commonly known as:  ADVIL,MOTRIN      TAKE these medications       albuterol 108 (90 BASE) MCG/ACT inhaler  Commonly known as:  VENTOLIN HFA  Inhale 2 puffs into the lungs 3 (three) times daily as needed.     amLODipine 10 MG tablet  Commonly known as:  NORVASC  Take 1 tablet (10 mg total) by mouth daily.     calcium carbonate 500 MG chewable tablet  Commonly known as:  TUMS - dosed in mg elemental calcium  Chew 1 tablet by mouth daily.     fluticasone 50 MCG/ACT nasal spray  Commonly  known as:  FLONASE  Place 1 spray into the nose as needed.     hydrALAZINE 50 MG tablet  Commonly known as:  APRESOLINE  Take 1 tablet (50 mg total) by mouth every 8 (eight) hours.     ICY HOT EX  Apply 1 application topically daily as needed (knee pain).     LORazepam 0.5 MG tablet  Commonly known as:  ATIVAN  Take 1 tablet (0.5 mg total) by mouth every 6 (six) hours as needed for anxiety (agitation).     omeprazole 20 MG capsule  Commonly known as:  PRILOSEC  Take 20 mg by mouth daily.     tiotropium 18 MCG inhalation capsule  Commonly known as:  SPIRIVA HANDIHALER  Place 1 capsule (18 mcg total) into inhaler and inhale daily.     traMADol 50 MG tablet  Commonly known as:  ULTRAM  Take 1 tablet (50 mg total) by mouth 3 (three) times daily as needed for pain.     traZODone 50 MG tablet  Commonly known as:  DESYREL  Take 1-2 tablets (50-100 mg total) by mouth at bedtime.       No Known Allergies     Follow-up Information   Follow up with Sable Feil, DDS On October 29, 2012. (4:15 pm)    Specialty:  Oral Surgery   Contact information:   [address] [place] [address] [phone]        The results of significant diagnostics from this hospitalization (including imaging, microbiology, ancillary and laboratory) are listed below for reference.    Significant Diagnostic Studies: No results found.  Microbiology: No results found for this or any previous visit (from the past 240 hour(s)).   Labs: Basic Metabolic Panel:  Recent Labs Lab 10/01/12 2200 10/02/12 0612 10/06/12 1015  NA 137 137 135  K 3.5 3.4* 3.7  CL 104 102 101  CO2 22 21 24   GLUCOSE 155* 117* 110*  BUN 17 20 14   CREATININE 0.51 0.52 0.65  CALCIUM 8.5 8.8 8.7   Liver Function Tests: No results found for this basename: AST, ALT, ALKPHOS, BILITOT, PROT, ALBUMIN,  in the last 168 hours No results found for this basename: LIPASE, AMYLASE,  in the last 168 hours No  results found for this basename: AMMONIA,  in the last 168 hours CBC:  Recent Labs Lab 10/01/12 2200 10/02/12 0612  WBC 3.5* 2.6*  NEUTROABS 2.4  --   HGB 13.1 13.5  HCT 35.5* 37.4*  MCV 92.7 93.7  PLT 213 205   Cardiac Enzymes:  Recent Labs Lab 10/01/12 2200  CKTOTAL 53   BNP: BNP (last 3 results) No results found for this basename: PROBNP,  in the last 8760 hours CBG:  Recent Labs Lab 10/03/12 2307  GLUCAP 121*       Signed:  REGALADO,BELKYS  [facility] 10/06/2012, 12:32 PM

## 2012-10-06 NOTE — Progress Notes (Signed)
Physical Therapy Treatment Patient Details Name: [name] [name] MRN: [id number] DOB: [birth year] Today's Date: 10/06/2012 Time: 4098-1191 PT Time Calculation (min): 23 min  PT Assessment / Plan / Recommendation  History of Present Illness Pt originally went to ED at [facility] for weakness and bilat knee pain.  He then developed a jaw dislocation and was transferred to [facility] for further treatment.   PT Comments   Patient making some slow progress today however requiring lots of cues and encouragement. Agreeable to recliner but refused ambulation. Per SW patient is being transferred to SNF at [place]  Follow Up Recommendations  SNF     Does the patient have the potential to tolerate intense rehabilitation     Barriers to Discharge        Equipment Recommendations  None recommended by PT    Recommendations for Other Services    Frequency Min 3X/week   Progress towards PT Goals Progress towards PT goals: Progressing toward goals  Plan Current plan remains appropriate    Precautions / Restrictions Precautions Precautions: Fall   Pertinent Vitals/Pain no apparent distress     Mobility  Bed Mobility Bed Mobility: Supine to Sit;Sitting - Scoot to Edge of Bed Supine to Sit: 4: Min assist;With rails Sitting - Scoot to [place] of Bed: 4: Min assist Details for Bed Mobility Assistance: Incr time for entire task, especailly with transition of trunk to fully upright position sitting at EOB. Use of chuck pad to scoot pt to EOB.   Transfers Transfers: Stand Pivot Transfers Sit to Stand: 3: Mod assist;From bed;With upper extremity assist Stand to Sit: 3: Mod assist;To chair/3-in-1;With upper extremity assist Stand Pivot Transfers: 3: Mod assist Details for Transfer Assistance: Assist for power up from bed and to control descent into chair.  Pt keeps hands on RW to pull himself up into standing. Ambulation/Gait Ambulation/Gait Assistance: Not tested (comment)    Exercises     PT  Diagnosis:    PT Problem List:   PT Treatment Interventions:     PT Goals (current goals can now be found in the care plan section)    Visit Information  Last PT Received On: 10/06/12 Assistance Needed: +2 (for safety) History of Present Illness: Pt originally went to ED at [facility] for weakness and bilat knee pain.  He then developed a jaw dislocation and was transferred to [facility] for further treatment.    Subjective Data      Cognition  Cognition Arousal/Alertness: Awake/alert Behavior During Therapy: WFL for tasks assessed/performed Overall Cognitive Status: No family/caregiver present to determine baseline cognitive functioning Area of Impairment: Orientation;Memory Orientation Level: Disoriented to;Time Memory: Decreased short-term memory    Balance  Balance Balance Assessed: Yes Static Sitting Balance Static Sitting - Balance Support: Bilateral upper extremity supported;Feet supported Static Sitting - Level of Assistance: 5: Stand by assistance;4: Min assist Static Sitting - Comment/# of Minutes: Pt initally requiring min assist for balance due to lean to right and posteriorly but eventually able to maintain balance with stand by assist.  Sat EOB total of 5 min. Static Standing Balance Static Standing - Balance Support: Right upper extremity supported;Left upper extremity supported Static Standing - Level of Assistance: 3: Mod assist Static Standing - Comment/# of Minutes: Assist for balance due to posterior lean.  End of Session PT - End of Session Equipment Utilized During Treatment: Gait belt Activity Tolerance: Patient limited by fatigue Patient left: in chair;with call bell/phone within reach Nurse Communication: Mobility status  GP     Fredrich Birks 10/06/2012, 1:33 PM 10/06/2012 Fredrich Birks PTA [phone] pager [phone] office

## 2012-10-06 NOTE — Clinical Social Work Placement (Signed)
Clinical Social Work Department  CLINICAL SOCIAL WORK PLACEMENT NOTE  10/06/2012  Patient: [name] [name]  Account Number: [id number]  Admit date: 10/01/12 Clinical Social Worker: Sabino Niemann LCSWA Date/time: 10/05/12 11:30 AM  Clinical Social Work is seeking post-discharge placement for this patient at the following level of care: SKILLED NURSING (*CSW will update this form in Epic as items are completed)  10/05/12  Patient/family provided with [facility] Department of Clinical Social Work's list of facilities offering this level of care within the geographic area requested by the patient (or if unable, by the patient's family).  10/05/12   Patient/family informed of their freedom to choose among providers that offer the needed level of care, that participate in Medicare, Medicaid or managed care program needed by the patient, have an available bed and are willing to accept the patient.  10/05/12  Patient/family informed of [facility]' ownership interest in [facility], as well as of the fact that they are under no obligation to receive care at this facility.  PASARR submitted to EDS on 10/05/12   PASARR number received from EDS on 10/05/12   FL2 transmitted to all facilities in geographic area requested by pt/family on 10/05/12   FL2 transmitted to all facilities within larger geographic area on  Patient informed that his/her managed care company has contracts with or will negotiate with certain facilities, including the following:  Patient/family informed of bed offers received: 10/06/2012  Patient chooses bed at [facility] Physician recommends and patient chooses bed at  Patient to be transferred to on 10/06/2012 Patient to be transferred to facility by [facility] The following physician request were entered in Epic:  Additional Comments:

## 2012-10-06 NOTE — Evaluation (Signed)
Occupational Therapy Evaluation Patient Details Name: [name] [name] MRN: [id number] DOB: 1934/07/21 Today's Date: 10/06/2012 Time: 4696-2952 OT Time Calculation (min): 23 min  OT Assessment / Plan / Recommendation History of present illness Pt originally went to ED at [facility] for weakness and bilat knee pain.  He then developed a jaw dislocation and was transferred to [facility] for further treatment.   Clinical Impression   Pt admitted with above.  Pt is a poor historian and unable to provide information regarding PLOF or level of assist available at discharge.  At this time, recommending SNF for d/c planning in order to further progress rehab and to maximize safety and independence with ADLs.    OT Assessment  Patient needs continued OT Services    Follow Up Recommendations  SNF;Supervision/Assistance - 24 hour    Barriers to Discharge Decreased caregiver support Pt unable to state. Per chart review and social worker report, pt does not have family assist at discharge.  Equipment Recommendations  3 in 1 bedside comode    Recommendations for Other Services    Frequency  Min 2X/week    Precautions / Restrictions Precautions Precautions: Fall   Pertinent Vitals/Pain See vitals    ADL  Eating/Feeding: Performed;Set up Where Assessed - Eating/Feeding: Chair Grooming: Performed;Wash/dry face;Minimal assistance Where Assessed - Grooming: Supported sitting Upper Body Bathing: Simulated;Moderate assistance Where Assessed - Upper Body Bathing: Supported sitting Lower Body Bathing: Simulated;Maximal assistance Where Assessed - Lower Body Bathing: Supported sit to stand Upper Body Dressing: Simulated;Moderate assistance Where Assessed - Upper Body Dressing: Supported sitting Lower Body Dressing: Performed;+1 Total assistance Where Assessed - Lower Body Dressing: Supported sitting Toilet Transfer: Simulated;Moderate assistance Toilet Transfer Method: Stand pivot Toilet  Transfer Equipment:  (bed<>chair) Equipment Used: Gait belt;Rolling walker Transfers/Ambulation Related to ADLs: Mod assist with RW for SPT from bed<>chair.  Assist for balance due to heavy posterior lean.     OT Diagnosis: Generalized weakness;Cognitive deficits  OT Problem List: Decreased strength;Decreased activity tolerance;Impaired balance (sitting and/or standing);Decreased cognition;Decreased knowledge of use of DME or AE OT Treatment Interventions: Self-care/ADL training;DME and/or AE instruction;Therapeutic activities;Cognitive remediation/compensation;Patient/family education;Balance training   OT Goals(Current goals can be found in the care plan section) Acute Rehab OT Goals OT Goal Formulation: With patient Time For Goal Achievement: 10/20/12 Potential to Achieve Goals: Good  Visit Information  Last OT Received On: 10/06/12 Assistance Needed: +2 (for safety) PT/OT Co-Evaluation/Treatment: Yes History of Present Illness: Pt originally went to ED at [facility] for weakness and bilat knee pain.  He then developed a jaw dislocation and was transferred to [facility] for further treatment.       Prior Functioning     Home Living Family/patient expects to be discharged to:: Skilled nursing facility Living Arrangements: Spouse/significant other Additional Comments: Pt poor historian and no family available to provide information. Prior Function Level of Independence: Independent with assistive device(s)         Vision/Perception     Cognition  Cognition Arousal/Alertness: Awake/alert Behavior During Therapy: WFL for tasks assessed/performed Overall Cognitive Status: No family/caregiver present to determine baseline cognitive functioning Area of Impairment: Orientation;Memory Orientation Level: Disoriented to;Time Memory: Decreased short-term memory    Extremity/Trunk Assessment Upper Extremity Assessment Upper Extremity Assessment: Overall WFL for tasks  assessed     Mobility Bed Mobility Bed Mobility: Supine to Sit;Sitting - Scoot to Edge of Bed Supine to Sit: 4: Min assist;With rails Sitting - Scoot to Edge of Bed: 4: Min assist Details for Bed  Mobility Assistance: Incr time for entire task, especailly with transition of trunk to fully upright position sitting at EOB. Use of chuck pad to scoot pt to EOB.   Transfers Transfers: Sit to Stand;Stand to Sit Sit to Stand: 3: Mod assist;From bed;With upper extremity assist Stand to Sit: 3: Mod assist;To chair/3-in-1;With upper extremity assist Details for Transfer Assistance: Assist for power up from bed and to control descent into chair.  Pt keeps hands on RW to pull himself up into standing.     Exercise     Balance Balance Balance Assessed: Yes Static Sitting Balance Static Sitting - Balance Support: Bilateral upper extremity supported;Feet supported Static Sitting - Level of Assistance: 5: Stand by assistance;4: Min assist Static Sitting - Comment/# of Minutes: Pt initally requiring min assist for balance due to lean to right and posteriorly but eventually able to maintain balance with stand by assist.  Sat EOB total of 5 min. Static Standing Balance Static Standing - Balance Support: Right upper extremity supported;Left upper extremity supported Static Standing - Level of Assistance: 3: Mod assist Static Standing - Comment/# of Minutes: Assist for balance due to posterior lean.   End of Session OT - End of Session Equipment Utilized During Treatment: Gait belt;Rolling walker Activity Tolerance: Patient tolerated treatment well Patient left: in chair;with call bell/phone within reach;with chair alarm set;with nursing/sitter in room Nurse Communication: Mobility status  GO    10/06/2012 Cipriano Mile OTR/L Pager [phone] Office [phone]  Cipriano Mile 10/06/2012, 1:33 PM

## 2012-10-06 NOTE — Progress Notes (Signed)
[facility] PROGRESS NOTE  [name] [name] AVW:[id number] DOB: 12-Apr-1934 DOA: 10/01/2012 PCP: Tillman Abide, MD  Assessment/Plan: 1-Jaw dislocation  Resolved at this time. On admission, Dr Toniann Fail discussed with Dr. [name] Splinter, plastic surgeon. Dr. [name] Splinter advised to place patient on soft diet and to observe if stable can be referred to outpatient maxillofacial surgeon.  Patient again had an acute jaw dislocation 9-2. appreciate ENT Dr Suszanne Conners evaluation who reduced the mandibular dislocation at bedside. Patient felt better after that.Dr Windy Canny scheduled  appointment with maxillofacial surgeon Dr. Graylon Gunning for 9/15 at 4:15 pm.  -continue with dysphagia diet to avoid recurrent dislocations.   2-pseudogout  recent intra-articular steroid injections per the discharge summary.  Denies further pain. PT eval recommends skilled nursing facility.   3-HTN:  Continue with Norvasc and Hydralazine.   4-Rhabdomyolysis  - per the notes patient CK levels have come normal after hydration.  CK normal on admission  5-Hypokalemia  Repeat B-met.  6-History of asthma  stable  7-History of prostate cancer  Code status: full code     Consultants:  Dr Suszanne Conners  Dr Elsie Saas  Procedures: Mandibular dislocation  reduced manually 9-3   Antibiotics:  none  HPI/Subjective: Agree with going to rehab. denies jaw pain. He is calm.   Objective: Filed Vitals:   10/06/12 0555  BP: 155/74  Pulse: 84  Temp: 97.4 F (36.3 C)  Resp: 16    Intake/Output Summary (Last 24 hours) at 10/06/12 0853 Last data filed at 10/05/12 1225  Gross per 24 hour  Intake    480 ml  Output      0 ml  Net    480 ml   There were no vitals filed for this visit.  Exam:   General:  Awake, in no distress.  Cardiovascular: S 1, S 2 RRR  Respiratory: CTA  Abdomen: BS present, soft, NT  Musculoskeletal: no edema.   Data Reviewed: Basic Metabolic Panel:  Recent Labs Lab 10/01/12 2200  10/02/12 0612  NA 137 137  K 3.5 3.4*  CL 104 102  CO2 22 21  GLUCOSE 155* 117*  BUN 17 20  CREATININE 0.51 0.52  CALCIUM 8.5 8.8   Liver Function Tests: No results found for this basename: AST, ALT, ALKPHOS, BILITOT, PROT, ALBUMIN,  in the last 168 hours No results found for this basename: LIPASE, AMYLASE,  in the last 168 hours No results found for this basename: AMMONIA,  in the last 168 hours CBC:  Recent Labs Lab 10/01/12 2200 10/02/12 0612  WBC 3.5* 2.6*  NEUTROABS 2.4  --   HGB 13.1 13.5  HCT 35.5* 37.4*  MCV 92.7 93.7  PLT 213 205   Cardiac Enzymes:  Recent Labs Lab 10/01/12 2200  CKTOTAL 53   BNP (last 3 results) No results found for this basename: PROBNP,  in the last 8760 hours CBG:  Recent Labs Lab 10/03/12 2307  GLUCAP 121*    No results found for this or any previous visit (from the past 240 hour(s)).   Studies: No results found.  Scheduled Meds: . amLODipine  10 mg Oral Daily  . calcium carbonate  1 tablet Oral Daily  . fluticasone  1 spray Each Nare Daily  . hydrALAZINE  50 mg Oral Q8H  . pantoprazole  40 mg Oral Daily  . tiotropium  18 mcg Inhalation Daily  . traZODone  50-100 mg Oral QHS   Continuous Infusions:   Principal Problem:   Jaw dislocation Active Problems:  ASTHMA   Bilateral leg weakness   Hypertension    Time spent: 25 minutes.     Kylena Mole  [facility] Pager [phone]. If 7PM-7AM, please contact night-coverage at [email or web address], password [facility] 10/06/2012, 8:53 AM  LOS: 5 days

## 2012-10-07 ENCOUNTER — Other Ambulatory Visit: Payer: Self-pay | Admitting: *Deleted

## 2012-10-07 ENCOUNTER — Encounter: Payer: Self-pay | Admitting: Internal Medicine

## 2012-10-07 ENCOUNTER — Non-Acute Institutional Stay (SKILLED_NURSING_FACILITY): Payer: Medicare Other | Admitting: Internal Medicine

## 2012-10-07 DIAGNOSIS — J449 Chronic obstructive pulmonary disease, unspecified: Secondary | ICD-10-CM

## 2012-10-07 DIAGNOSIS — M25569 Pain in unspecified knee: Secondary | ICD-10-CM

## 2012-10-07 DIAGNOSIS — S0300XD Dislocation of jaw, unspecified side, subsequent encounter: Secondary | ICD-10-CM

## 2012-10-07 DIAGNOSIS — K219 Gastro-esophageal reflux disease without esophagitis: Secondary | ICD-10-CM

## 2012-10-07 DIAGNOSIS — M1120 Other chondrocalcinosis, unspecified site: Secondary | ICD-10-CM | POA: Insufficient documentation

## 2012-10-07 DIAGNOSIS — Z5189 Encounter for other specified aftercare: Secondary | ICD-10-CM

## 2012-10-07 DIAGNOSIS — I10 Essential (primary) hypertension: Secondary | ICD-10-CM

## 2012-10-07 DIAGNOSIS — M25561 Pain in right knee: Secondary | ICD-10-CM

## 2012-10-07 DIAGNOSIS — R29898 Other symptoms and signs involving the musculoskeletal system: Secondary | ICD-10-CM

## 2012-10-07 MED ORDER — TRAMADOL HCL 50 MG PO TABS: ORAL_TABLET | ORAL | Status: DC

## 2012-10-07 MED ORDER — TRAMADOL HCL 50 MG PO TABS: ORAL_TABLET | ORAL | Status: AC

## 2012-10-07 NOTE — Assessment & Plan Note (Signed)
Continue albuterol and spiriva HFA

## 2012-10-07 NOTE — Progress Notes (Signed)
MRN: [id number] Name: [name] [name]  Sex: male Age: 78 y.o. DOB: 1934-04-13  PSC #: Ronni Rumble Facility/Room: 125A Level Of Care: SNF Provider: Merrilee Seashore D Emergency Contacts: Extended Emergency Contact Information Primary Emergency Contact: Disbro,Edna Address: [address]          [place], [place] [address] [place] Home Phone: [phone] Relation: Spouse  Code Status: FULL  Allergies: Review of patient's allergies indicates no known allergies.  Chief Complaint  Patient presents with  . nursing home admission    HPI: Patient is 78 y.o. male who is admitted for PT/OT for leg weakness and s/p mandibular dislocation, reduced. At last hosp [facility] because not felt pt had capacity to desal with illness from home.  Past Medical History  Diagnosis Date  . Allergy   . Asthma   . Cancer     PROSTATE  . Colon polyps   . Diverticulitis   . GERD (gastroesophageal reflux disease)   . Sleep disturbance, unspecified   . Constipation   . Pseudogout     Past Surgical History  Procedure Laterality Date  . Prostate surgery    . Colon surgery  1998    COLONIC PERF/OSTOMY      Medication List       This list is accurate as of: 10/07/12  3:28 PM.  Always use your most recent med list.               albuterol 108 (90 BASE) MCG/ACT inhaler  Commonly known as:  VENTOLIN HFA  Inhale 2 puffs into the lungs 3 (three) times daily as needed.     amLODipine 10 MG tablet  Commonly known as:  NORVASC  Take 1 tablet (10 mg total) by mouth daily.     calcium carbonate 500 MG chewable tablet  Commonly known as:  TUMS - dosed in mg elemental calcium  Chew 1 tablet by mouth daily.     fluticasone 50 MCG/ACT nasal spray  Commonly known as:  FLONASE  Place 1 spray into the nose as needed.     hydrALAZINE 50 MG tablet  Commonly known as:  APRESOLINE  Take 1 tablet (50 mg total) by mouth every 8 (eight) hours.     ICY HOT EX  Apply topically daily as  needed.     LORazepam 0.5 MG tablet  Commonly known as:  ATIVAN  Take 1 tablet (0.5 mg total) by mouth every 6 (six) hours as needed for anxiety (agitation).     omeprazole 20 MG capsule  Commonly known as:  PRILOSEC  Take 20 mg by mouth daily.     tiotropium 18 MCG inhalation capsule  Commonly known as:  SPIRIVA HANDIHALER  Place 1 capsule (18 mcg total) into inhaler and inhale daily.     traMADol 50 MG tablet  Commonly known as:  ULTRAM  Take one tablet by mouth every 8 hours as needed for pain     traZODone 50 MG tablet  Commonly known as:  DESYREL  Take 1-2 tablets (50-100 mg total) by mouth at bedtime.        Meds ordered this encounter  Medications  . Menthol, Topical Analgesic, (ICY HOT EX)    Sig: Apply topically daily as needed.    Immunization History  Administered Date(s) Administered  . Influenza Split 11/11/2010, 11/21/2011  . Influenza Whole 11/03/2004, 11/20/2006, 11/10/2007, 11/24/2008, 11/14/2009  . Pneumococcal Polysaccharide 11/10/2003  . Td 09/23/2002    History  Substance  Use Topics  . Smoking status: Former Smoker    Types: Cigars  . Smokeless tobacco: Never Used  . Alcohol Use: No    Family history is noncontributory    Review of Systems  DATA OBTAINED: from patient, nurse, medical record, family member GENERAL: Feels well no fevers, fatigue, appetite changes SKIN: No itching, rash or wounds EYES: No eye pain, redness, discharge EARS: No earache, tinnitus, change in hearing NOSE: No congestion, drainage or bleeding  MOUTH/THROAT: No mouth or tooth pain, No sore throat, No difficulty chewing or swallowing  RESPIRATORY: No cough, wheezing, SOB CARDIAC: No chest pain, palpitations, lower extremity edema  GI: No abdominal pain, No N/V/D or constipation, No heartburn or reflux  GU: No dysuria, frequency or urgency, or incontinence  MUSCULOSKELETAL: No unrelieved bone/joint pain NEUROLOGIC: Awake, alert, appropriate to situation, No  change in mental status. Moves all four, no focal deficits PSYCHIATRIC: No overt anxiety or sadness. Sleeps well. No behavior issue.  AMBULATION:    Filed Vitals:   10/07/12 1501  BP: 124/62  Pulse: 80  Temp: 97.9 F (36.6 C)  Resp: 18    Physical Exam  GENERAL APPEARANCE: Alert, conversant. Appropriately groomed. No acute distress.  SKIN: No diaphoresis rash, or wounds HEAD: Normocephalic, atraumatic  EYES: Conjunctiva/lids clear. Pupils round, reactive. EOMs intact.  EARS: External exam WNL, canals clear. Hearing grossly normal.  NOSE: No deformity or discharge.  MOUTH/THROAT: Lips w/o lesions. Mouth and throat normal. Tongue moist, w/o lesion.  NECK: No thyroid tenderness, enlargement or nodule  RESPIRATORY: Breathing is even, unlabored. Lung sounds are clear   CARDIOVASCULAR: Heart RRR no murmurs, rubs or gallops. No peripheral edema.  ARTERIAL: radial pulse 2+, DP pulse 1+  VENOUS: No varicosities. No venous stasis skin changes  GASTROINTESTINAL: Abdomen is soft, non-tender, not distended w/ normal bowel sounds. No mass, ventral or inguinal hernia. No organomegally GENITOURINARY: Bladder non tender, not distended  MUSCULOSKELETAL: No abnormal joints or musculature NEUROLOGIC: Oriented X3. Cranial nerves 2-12 grossly intact. Moves all extremities no tremor. PSYCHIATRIC: Mood and affect appropriate to situation, no behavioral issues  Patient Active Problem List   Diagnosis Date Noted  . Pseudogout   . Hypertension 10/04/2012  . Jaw dislocation 10/01/2012  . Bilateral leg weakness 07/14/2012  . Osteoarthritis of right knee 06/08/2012  . Bilateral knee pain 05/25/2012  . Depression 05/25/2012  . Routine general medical examination at a health care facility 05/16/2011  . CONSTIPATION, CHRONIC 05/30/2009  . ALLERGIC RHINITIS 10/26/2006  . ASTHMA 10/26/2006  . COPD (chronic obstructive pulmonary disease) 10/26/2006  . GERD 10/26/2006  . Myelodysplastic syndrome,  unspecified 10/26/2006  . SLEEP DISORDER 10/26/2006  . PROSTATE CANCER, HX OF 10/26/2006  . COLONIC POLYPS, HX OF 10/26/2006  . DIVERTICULITIS, HX OF 10/26/2006    Functional assessment:   CBC    Component Value Date/Time   WBC 2.6* 10/02/2012 0612   RBC 3.99* 10/02/2012 0612   HGB 13.5 10/02/2012 0612   HCT 37.4* 10/02/2012 0612   PLT 205 10/02/2012 0612   MCV 93.7 10/02/2012 0612   LYMPHSABS 0.4* 10/01/2012 2200   MONOABS 0.7 10/01/2012 2200   EOSABS 0.0 10/01/2012 2200   BASOSABS 0.0 10/01/2012 2200    CMP     Component Value Date/Time   NA 135 10/06/2012 1015   K 3.7 10/06/2012 1015   CL 101 10/06/2012 1015   CO2 24 10/06/2012 1015   GLUCOSE 110* 10/06/2012 1015   BUN 14 10/06/2012 1015   CREATININE 0.65  10/06/2012 1015   CALCIUM 8.7 10/06/2012 1015   PROT 7.3 06/29/2012 0831   ALBUMIN 4.3 06/29/2012 0831   AST 20 06/29/2012 0831   ALT 19 06/29/2012 0831   ALKPHOS 49 06/29/2012 0831   BILITOT 2.0* 06/29/2012 0831   GFRNONAA >90 10/06/2012 1015   GFRAA >90 10/06/2012 1015    Assessment and Plan  Bilateral leg weakness Initial presentation for pt in [place] ED. Pt was found to be in mild rhabdomyolysis (CK 800) that resolved-last CK 8/29-53. MRI head- NAD. Pt needs PT/OT  Pseudogout Dx as source of pt's bilateral knee pain with knee aspirations showing CPPD crystals as part of weakness work up. Rheumatology was consulted and both knees were given steroid injections. On d/c pt was told to stop motrin.  Jaw dislocation During last hosp pt had jaw dislocation that was reduced per ENT but then it dislocated again. At this point Dr. [name] Splinter, [facility] plastic surgeon was consulted and pt was sent to [facility].Plastics advised soft diet and observation.If stable can go to [facility] surgeon and pt has appt with Dr. Graylon Gunning on 9/15 at 4:15 pm. Will continue soft diet. No pain medication is needed.  Hypertension Stable;continue hydralazine and norvasc  GERD Continue omeprazole 20 mg daily  COPD  (chronic obstructive pulmonary disease) Continue albuterol and spiriva HFA    Margit Hanks, MD

## 2012-10-07 NOTE — Assessment & Plan Note (Signed)
Initial presentation for pt in [place] ED. Pt was found to be in mild rhabdomyolysis (CK 800) that resolved-last CK 8/29-53. MRI head- NAD. Pt needs PT/OT

## 2012-10-07 NOTE — Assessment & Plan Note (Signed)
During last hosp pt had jaw dislocation that was reduced per ENT but then it dislocated again. At this point Dr. [name] Splinter, [facility] plastic surgeon was consulted and pt was sent to [facility].Plastics advised soft diet and observation.If stable can go to [facility] surgeon and pt has appt with Dr. Graylon Gunning on 9/15 at 4:15 pm. Will continue soft diet. No pain medication is needed.

## 2012-10-07 NOTE — Assessment & Plan Note (Signed)
Dx as source of pt's bilateral knee pain with knee aspirations showing CPPD crystals as part of weakness work up. Rheumatology was consulted and both knees were given steroid injections. On d/c pt was told to stop motrin.

## 2012-10-07 NOTE — Assessment & Plan Note (Signed)
Stable;continue hydralazine and norvasc

## 2012-10-07 NOTE — Assessment & Plan Note (Signed)
-  Continue omeprazole 20 mg daily

## 2012-10-07 NOTE — Progress Notes (Signed)
MRN: [id number] Name: [name] [name]  Sex: male Age: 78 y.o. DOB: 24-Aug-1934  PSC #:  Facility/Room: Level Of Care: SNF Provider: Merrilee Seashore D Emergency Contacts: Extended Emergency Contact Information Primary Emergency Contact: Thum,Edna Address: [address]          [place], [place] [address] [place] Home Phone: [phone] Relation: Spouse  Code Status:   Allergies: Review of patient's allergies indicates no known allergies.  No chief complaint on file.   HPI: Patient is 78 y.o. male who  Past Medical History  Diagnosis Date  . Allergy   . Asthma   . Cancer     PROSTATE  . Colon polyps   . Diverticulitis   . GERD (gastroesophageal reflux disease)   . Sleep disturbance, unspecified   . Constipation   . Pseudogout     Past Surgical History  Procedure Laterality Date  . Prostate surgery    . Colon surgery  1998    COLONIC PERF/OSTOMY      Medication List       This list is accurate as of: 10/07/12  5:11 PM.  Always use your most recent med list.               albuterol 108 (90 BASE) MCG/ACT inhaler  Commonly known as:  VENTOLIN HFA  Inhale 2 puffs into the lungs 3 (three) times daily as needed.     amLODipine 10 MG tablet  Commonly known as:  NORVASC  Take 1 tablet (10 mg total) by mouth daily.     calcium carbonate 500 MG chewable tablet  Commonly known as:  TUMS - dosed in mg elemental calcium  Chew 1 tablet by mouth daily.     fluticasone 50 MCG/ACT nasal spray  Commonly known as:  FLONASE  Place 1 spray into the nose as needed.     hydrALAZINE 50 MG tablet  Commonly known as:  APRESOLINE  Take 1 tablet (50 mg total) by mouth every 8 (eight) hours.     ICY HOT EX  Apply topically daily as needed.     LORazepam 0.5 MG tablet  Commonly known as:  ATIVAN  Take 1 tablet (0.5 mg total) by mouth every 6 (six) hours as needed for anxiety (agitation).     omeprazole 20 MG capsule  Commonly known as:  PRILOSEC  Take 20  mg by mouth daily.     tiotropium 18 MCG inhalation capsule  Commonly known as:  SPIRIVA HANDIHALER  Place 1 capsule (18 mcg total) into inhaler and inhale daily.     traMADol 50 MG tablet  Commonly known as:  ULTRAM  Take one tablet by mouth every 8 hours as needed for pain     traZODone 50 MG tablet  Commonly known as:  DESYREL  Take 1-2 tablets (50-100 mg total) by mouth at bedtime.        No orders of the defined types were placed in this encounter.    Immunization History  Administered Date(s) Administered  . Influenza Split 11/11/2010, 11/21/2011  . Influenza Whole 11/03/2004, 11/20/2006, 11/10/2007, 11/24/2008, 11/14/2009  . Pneumococcal Polysaccharide 11/10/2003  . Td 09/23/2002    History  Substance Use Topics  . Smoking status: Former Smoker    Types: Cigars  . Smokeless tobacco: Never Used  . Alcohol Use: No    Family history is noncontributory    Review of Systems  DATA OBTAINED: from patient, nurse, medical record, family member GENERAL: Feels well  no fevers, fatigue, appetite changes SKIN: No itching, rash or wounds EYES: No eye pain, redness, discharge EARS: No earache, tinnitus, change in hearing NOSE: No congestion, drainage or bleeding  MOUTH/THROAT: No mouth or tooth pain, No sore throat, No difficulty chewing or swallowing  RESPIRATORY: No cough, wheezing, SOB CARDIAC: No chest pain, palpitations, lower extremity edema  GI: No abdominal pain, No N/V/D or constipation, No heartburn or reflux  GU: No dysuria, frequency or urgency, or incontinence  MUSCULOSKELETAL: No unrelieved bone/joint pain NEUROLOGIC: Awake, alert, appropriate to situation, No change in mental status. Moves all four, no focal deficits PSYCHIATRIC: No overt anxiety or sadness. Sleeps well. No behavior issue.  AMBULATION:    There were no vitals filed for this visit.  Physical Exam  GENERAL APPEARANCE: Alert, conversant. Appropriately groomed. No acute distress.  SKIN:  No diaphoresis rash, or wounds HEAD: Normocephalic, atraumatic  EYES: Conjunctiva/lids clear. Pupils round, reactive. EOMs intact.  EARS: External exam WNL, canals clear. Hearing grossly normal.  NOSE: No deformity or discharge.  MOUTH/THROAT: Lips w/o lesions. Mouth and throat normal. Tongue moist, w/o lesion.  NECK: No thyroid tenderness, enlargement or nodule  RESPIRATORY: Breathing is even, unlabored. Lung sounds are clear   CARDIOVASCULAR: Heart RRR no murmurs, rubs or gallops. No peripheral edema.  ARTERIAL: radial pulse 2+, DP pulse 1+  VENOUS: No varicosities. No venous stasis skin changes  GASTROINTESTINAL: Abdomen is soft, non-tender, not distended w/ normal bowel sounds. No mass, ventral or inguinal hernia. No organomegally GENITOURINARY: Bladder non tender, not distended  MUSCULOSKELETAL: No abnormal joints or musculature NEUROLOGIC: Oriented X3. Cranial nerves 2-12 grossly intact. Moves all extremities no tremor. PSYCHIATRIC: Mood and affect appropriate to situation, no behavioral issues  Patient Active Problem List   Diagnosis Date Noted  . Pseudogout   . Hypertension 10/04/2012  . Jaw dislocation 10/01/2012  . Bilateral leg weakness 07/14/2012  . Osteoarthritis of right knee 06/08/2012  . Bilateral knee pain 05/25/2012  . Depression 05/25/2012  . Routine general medical examination at a health care facility 05/16/2011  . CONSTIPATION, CHRONIC 05/30/2009  . ALLERGIC RHINITIS 10/26/2006  . ASTHMA 10/26/2006  . COPD (chronic obstructive pulmonary disease) 10/26/2006  . GERD 10/26/2006  . Myelodysplastic syndrome, unspecified 10/26/2006  . SLEEP DISORDER 10/26/2006  . PROSTATE CANCER, HX OF 10/26/2006  . COLONIC POLYPS, HX OF 10/26/2006  . DIVERTICULITIS, HX OF 10/26/2006    Functional assessment:   CBC    Component Value Date/Time   WBC 2.6* 10/02/2012 0612   RBC 3.99* 10/02/2012 0612   HGB 13.5 10/02/2012 0612   HCT 37.4* 10/02/2012 0612   PLT 205 10/02/2012  0612   MCV 93.7 10/02/2012 0612   LYMPHSABS 0.4* 10/01/2012 2200   MONOABS 0.7 10/01/2012 2200   EOSABS 0.0 10/01/2012 2200   BASOSABS 0.0 10/01/2012 2200    CMP     Component Value Date/Time   NA 135 10/06/2012 1015   K 3.7 10/06/2012 1015   CL 101 10/06/2012 1015   CO2 24 10/06/2012 1015   GLUCOSE 110* 10/06/2012 1015   BUN 14 10/06/2012 1015   CREATININE 0.65 10/06/2012 1015   CALCIUM 8.7 10/06/2012 1015   PROT 7.3 06/29/2012 0831   ALBUMIN 4.3 06/29/2012 0831   AST 20 06/29/2012 0831   ALT 19 06/29/2012 0831   ALKPHOS 49 06/29/2012 0831   BILITOT 2.0* 06/29/2012 0831   GFRNONAA >90 10/06/2012 1015   GFRAA >90 10/06/2012 1015    Assessment and Plan  No problem-specific  assessment & plan notes found for this encounter.   Margit Hanks, MD    This encounter was created in error - please disregard. This encounter was created in error - please disregard.

## 2012-10-18 ENCOUNTER — Emergency Department (HOSPITAL_COMMUNITY)
Admission: EM | Admit: 2012-10-18 | Discharge: 2012-11-03 | Disposition: E | Payer: Medicare Other | Attending: Emergency Medicine | Admitting: Emergency Medicine

## 2012-10-18 DIAGNOSIS — J690 Pneumonitis due to inhalation of food and vomit: Secondary | ICD-10-CM | POA: Insufficient documentation

## 2012-10-18 DIAGNOSIS — K922 Gastrointestinal hemorrhage, unspecified: Secondary | ICD-10-CM | POA: Insufficient documentation

## 2012-10-18 DIAGNOSIS — I469 Cardiac arrest, cause unspecified: Secondary | ICD-10-CM | POA: Insufficient documentation

## 2012-10-18 MED ORDER — EPINEPHRINE HCL 0.1 MG/ML IJ SOSY
PREFILLED_SYRINGE | INTRAMUSCULAR | Status: DC | PRN
  Administered 2012-10-18: 1 mg via INTRAVENOUS

## 2012-10-18 MED FILL — Medication: Qty: 1 | Status: AC

## 2012-11-03 NOTE — Progress Notes (Signed)
Chaplain responded to page from nursing. Patient expired. No family members present. Wife in route. Nursing to call pastoral services when family arrives.   11/14/12 0710  Clinical Encounter Type  Visited With Health care provider  Visit Type Initial;Death  Referral From Nurse

## 2012-11-03 NOTE — Code Documentation (Signed)
Patient time of death occurred at [phone]

## 2012-11-03 NOTE — ED Notes (Addendum)
Per EMS: pt coming from [facility] found unresponsive, tachycardic at 120 BP 90/70 and respirations of 30 bpm. Pt lost pulses and became apneic just prior to arrival. Down time at 0607. Pt placed on Lucus. EMS reports 300 mL of emesis and unable to bag pt because of continued emesis.

## 2012-11-03 NOTE — ED Provider Notes (Signed)
CSN: [id number]     Arrival date & time 2012/11/06  0605 History   First MD Initiated Contact with Patient November 06, 2012 [phone]     Chief Complaint  Patient presents with  . Cardiac Arrest   (Consider location/radiation/quality/duration/timing/severity/associated sxs/prior Treatment) HPI  Please note that this is a late entry. This patient was seen and evaluated by me immediately upon his presentation to the emergency department. The patient was brought to the emergency department by EMS from the skilled nursing facility where [facility] resides, [facility].  Paramedics were dispatched for a call of unresponsiveness. Paramedics arrived at the scene to find the patient indeed unresponsive with oxygen saturations of 80%. He was responsive only to sternal rub. He was placed on CPAP and brought to the emergency department. However, approximately 1-2 minutes prior to arrival, the patient became pulseless and was found to be asystolic on cardiac monitor.  Lucas device was placed and CPR was initiated. The patient did not receive any IV medications on route. At approximately the same time that he became pulseless he vomited a very large amount of coffee-ground emesis, paramedics were unable to adequately manage his airway due to copious amounts of coffee-ground emesis in the oropharynx. They have brought in  Bottle with approx 300 mL of coffee ground emesis.   No past medical history on file. No past surgical history on file. No family history on file. History  Substance Use Topics  . Smoking status: Not on file  . Smokeless tobacco: Not on file  . Alcohol Use: Not on file     Review of Systems Unable to obtain - automatic level 5  Allergies  Review of patient's allergies indicates not on file.  Home Medications  No current outpatient prescriptions on file. Pulse 0 Physical Exam  Gen: well developed and well nourished appearing, unresponsive Head: NCAT Eyes: pupils, 4mm fixed and equal Nose: no  epistaixis or rhinorrhea, coffee ground appearing material surrounding the nares and is present over most of the face.  Mouth/throat: large amount of coffee ground emesis pooled in the oropharynx - RT suctioned approximately of emesis from the patient's oropharynx.  Neck: normal to i nspection Lungs: no chest rise CV: precordium is silent, pulseless carotid and femoral arteries Abd: soft, nondistended Back: not examined Skin: cool, covered in multiple places with coffee ground emesis Ext: no edema Neuro: CN ii-xii grossly intact, no focal deficits Psyche; normal affect,  calm and cooperative.   ED Course  Procedures (including critical care time)   MDM  The patient presented in asystole without response to epinephrine. CPR was continued in the emergency department. A very large amount of coffee-ground emesis had pooled in the patient's oropharynx and was suctioned by respiratory therapy. Approximately 200 cc in total.  Patient had no response to epinephrine. He had obviously aspirated huge quantities of emesis on route, and the emergency department and likely most of the night. No heart sounds were detectable on auscultation. Patient remained in asystole on the monitor.  Further efforts were deemed futile and the patient was pronounced dead. Please see code sheet for exact time. The team was in complete agreement with the decision to end the CODE BLUE.  I notified Dr. Lyn Hollingshead, the attending on call for the patient's primary care physician, Dr. Raphael Gibney. This is not a case that will be referred to medical examiner.    Brandt Loosen, MD Nov 06, 2012 [phone]

## 2012-11-03 DEATH — deceased

## 2014-05-26 NOTE — Consult Note (Signed)
Referring Physician:  Alric Seton   Primary Care Physician:  Alric Seton : [place], [address], [place], [place] [address], [place] [phone]  Reason for Consult: Admit Date: 29-Sep-2012  Chief Complaint: cant walk  Reason for Consult: weakness   History of Present Illness: History of Present Illness:   80 yo RHD M presents to [facility] due to inability to ambulate.  Pt reports that he has been unable to walk for the past few days and that he feels like it is secondary to pain in both knees.  No muscle pains, no diplopia, no dysphage and arms feel strong.  Pt denies back pain, neck pain or incontinence.  ROS:  General weakness   HEENT no complaints   Cardiac no complaints   GI no complaints   GU no complaints   Musculoskeletal joint pain  joint swelling   Extremities no complaints   Skin no complaints   Neuro no complaints   Endocrine no complaints   Psych no complaints   Past Medical/Surgical Hx:  Asthma:   Anxiety:   Prostate Cancer:   Prostatectomy:   Past Medical/ Surgical Hx:  Past Medical History as above   Past Surgical History as above   Home Medications: Medication Instructions Last Modified Date/Time  ibuprofen 200 mg oral tablet 2 tabs (411m) orally every 4 to 6 hours as needed for headaches. 26-Aug-14 09:39  traZODone 50 mg oral tablet 1 tab(s) orally once a day (at bedtime) 26-Aug-14 09:39  Spiriva 18 mcg inhalation capsule 1 cap(s) via handihaler once a day. 26-Aug-14 09:39   Allergies:  No Known Allergies:   Allergies:  Allergies NKDA   Social/Family History: Employment Status: retired  Lives With: spouse  Living Arrangements: house  Social History: no tob, no EtOH, no illicits  Family History: no hx of autoimmune dz   Vital Signs: **Vital Signs.:   28-Aug-14 04:58  Vital Signs Type Q 4hr  Temperature Temperature (F) 97.2  Temperature Source oral  Pulse Pulse 76  Respirations  Respirations 20  Systolic BP Systolic BP 1604 Diastolic BP (mmHg) Diastolic BP (mmHg) 80  Mean BP 98  Pulse Ox % Pulse Ox % 96  Pulse Ox Activity Level  At rest  Oxygen Delivery Room Air/ 21 %    08:00  Telemetry pattern Cardiac Rhythm Normal sinus rhythm; pattern reported by Telemetry Clerk; 87   Physical Exam: General: NAD, resting comfortable, nl weight  HEENT: normocephalic, sclera nonicteric, oropharynx clear  Neck: supple, no JVD, no bruits  Chest: wheezing B, good movement  Cardiac: RRR, no murmurs, no edema, 2+ pulses  Extremities: no C/C;  FROM;  there are B knee effusions R>L that are warm   Neurologic Exam: Mental Status: alert and oriented x 3, normal  language, follows complex commands, mild dysarthria may be secondary to lack of dentation  Cranial Nerves: PERRLA, EOMI, nl VF, face symmetric, tongue midline, shoulder shrug equal  Motor Exam: 5/5 B UE, 5-/5 B LE with trace atrophy, no fasciculations, normal tone, no tremor  Deep Tendon Reflexes: 2+/4 B except 1+/4 R patellar but this may be secondary to edema, plantars downgoing B, no Hoffman  Sensory Exam: pinprick, temperature, and vibration intact B  Coordination: FTN normal, deferred HTS secondary to knee pain, gait untestable due to pain   Lab Results: Thyroid:  26-Aug-14 08:52   Thyroid Stimulating Hormone 2.10 (0.45-4.50 (International Unit)  ----------------------- Pregnant patients have  different reference  ranges  for TSH:  - - - - - - - - - -  Pregnant, first trimetser:  0.36 - 2.50 uIU/mL)  Hepatic:  26-Aug-14 08:52   Bilirubin, Total  2.7  Alkaline Phosphatase 66  SGPT (ALT) 18  SGOT (AST) 31  Total Protein, Serum 7.0  Albumin, Serum 3.5  General Ref:  26-Aug-14 16:01   ANA Comprehensive Panel ========== TEST NAME ==========  ========= RESULTS =========  = REFERENCE RANGE =  ANA COMPREHENSIVE PANEL  ANA Comprehensive Panel Anti-DNA (DS) Ab Qn             [   <1 IU/mL             ]                0-9               Negative      <5                                                   Equivocal  5 - 9                                                   Positive      >9 RNP Antibodies                  [   <0.2 AI              ]           0.0-0.9 Nicolas Sisler Antibodies                [   <0.2 AI              ]           0.0-0.9 Antiscleroderma-70 Antibodies   [   <0.2 AI              ]           0.0-0.9 Sjogren's Anti-SS-A             [   0.3 AI               ]           0.0-0.9 Sjogren's Anti-SS-B          [   <0.2 AI              ]           0.0-0.9 Antichromatin Antibodies        [   <0.2 AI              ]           0.0-0.9 Anti-Jo-1                       [   <0.2 AI              ]           0.0-0.9 Anti-Centromere B Antibodies    [   <0.2 AI              ]  0.0-0.9 See below:                      [   Final Report         ]                   Autoantibody                       Disease Association ------------------------------------------------------------         Condition                  Frequency ---------------------   ------------------------   --------- Antinuclear Antibody,    SLE, mixed connective Direct (ANA-D)           tissue diseases ---------------------   ------------------------   --------- dsDNA                    SLE                        40 - 60% ---------------------   ------------------------   --------- Chromatin                Drug induced SLE                90%                          SLE                        48 - 97% ---------------------   ------------------------   --------- SSA (Ro)                 SLE                        25 - 35%                          Sjogren's Syndrome         40 - 70%                          Neonatal Lupus                 100% ---------------------   ------------------------   --------- SSB (La)                 SLE                             10%                          Sjogren's Syndrome               30% ---------------------   -----------------------    --------- Sm (anti-Jaydon Soroka)          SLE                        15 - 30% ---------------------   -----------------------    --------- RNP                      Mixed Connective Tissue  Disease                         95% (U1 nRNP,          SLE                        30 - 50% anti-ribonucleoprotein)  Polymyositis and/or                          Dermatomyositis                 20% ---------------------   ------------------------   --------- Scl-70 (antiDNA          Scleroderma (diffuse)      20 - 35% topoisomerase)           Crest                           13% ---------------------   ------------------------   --------- Jo-1                     Polymyositis and/or                          Dermatomyositis            20 - 40% ---------------------   ------------------------   --------- Centromere B             Scleroderma - Crest                          variant                         80%               [facility]            No: [id number]  [address], [place], [place] [address]           Lindon Romp, MD         [phone]   Result(s) reported on 29 Sep 2012 at 04:21PM.  Latex RA, Titer ========== TEST NAME ==========  ========= RESULTS =========  = REFERENCE RANGE =  LATEX RA & TITER  Rheumatoid Arthritis Factor RA Latex Turbid.                [H  15.5 IU/mL           ]          0.0-13.9               [facility]       No: [id number]           [address] [place], [place] [address]           Lindon Romp, MD         [phone]   Result(s) reported on 29 Sep 2012 at 04:21PM.  Routine Chem:  26-Aug-14 08:52   Lipase  40 (Result(s) reported on 28 Sep 2012 at 09:27AM.)  28-Aug-14 04:15   Glucose, Serum  171  BUN 12  Creatinine (comp) 0.62  Sodium, Serum 137  Potassium, Serum 3.6  Chloride, Serum  108  CO2, Serum 25  Calcium (Total), Serum 8.5  Anion Gap  4   Osmolality (calc)  278  eGFR (African American) >60  eGFR (Non-African American) >60 (eGFR values <8m/min/1.73 m2 may be an indication of chronic kidney disease (CKD). Calculated eGFR is useful in patients with stable renal function. The eGFR calculation will not be reliable in acutely ill patients when serum creatinine is changing rapidly. It is not useful in  patients on dialysis. The eGFR calculation may not be applicable to patients at the low and high extremes of body sizes, pregnant women, and vegetarians.)  Uric Acid, Serum  3.1 (Result(s) reported on 30 Sep 2012 at 05:20AM.)  Cardiac:  26-Aug-14 08:52   Troponin I 0.03 (0.00-0.05 0.05 ng/mL or less: NEGATIVE  Repeat testing in 3-6 hrs  if clinically indicated. >0.05 ng/mL: POTENTIAL  MYOCARDIAL INJURY. Repeat  testing in 3-6 hrs if  clinically indicated. NOTE: An increase or decrease  of 30% or more on serial  testing suggests a  clinically important change)  28-Aug-14 04:15   CK, Total 151 (Result(s) reported on 30 Sep 2012 at 05:20AM.)  Routine UA:  26-Aug-14 08:51   Color (UA) Amber  Clarity (UA) Clear  Glucose (UA) Negative  Bilirubin (UA) Negative  Ketones (UA) 1+  Specific Gravity (UA) 1.019  Blood (UA) 1+  pH (UA) 6.0  Protein (UA) 30 mg/dL  Nitrite (UA) Negative  Leukocyte Esterase (UA) Negative (Result(s) reported on 28 Sep 2012 at 09:44AM.)  RBC (UA) 19 /HPF  WBC (UA) 3 /HPF  Bacteria (UA) NONE SEEN  Epithelial Cells (UA) NONE SEEN  Mucous (UA) PRESENT (Result(s) reported on 28 Sep 2012 at 09:44AM.)  Routine Hem:  26-Aug-14 08:52   Segmented Neutrophils 68  Lymphocytes 20  Monocytes 12  Diff Comment 1 RBCs APPEAR NORMAL  Diff Comment 2 NORMAL PLT MORPHOLGY  Result(s) reported on 28 Sep 2012 at 10:12AM.    16:01   Erythrocyte Sed Rate  35 (Result(s) reported on 28 Sep 2012 at 04:30PM.)  27-Aug-14 04:01   WBC (CBC) 8.4  RBC (CBC)  3.67  Hemoglobin (CBC)  12.9  Hematocrit (CBC)  35.7   Platelet Count (CBC)  96  MCV 97  MCH  35.1  MCHC  36.1  RDW 13.8  Neutrophil % 60.3  Lymphocyte % 16.2  Monocyte % 23.1  Eosinophil % 0.2  Basophil % 0.2  Neutrophil # 5.1  Lymphocyte # 1.4  Monocyte #  1.9  Eosinophil # 0.0  Basophil # 0.0 (Result(s) reported on 29 Sep 2012 at 05:17AM.)   Radiology Results: MRI:    27-Aug-14 10:04, MRI Brain Without Contrast  MRI Brain Without Contrast   REASON FOR EXAM:    ambulatory dysfunction, r/o cva  COMMENTS:       PROCEDURE: MR  - MR BRAIN WO CONTRAST  - Sep 29 2012 10:04AM     RESULT: Multiplanar images were obtained through the brain without   administration of gadolinium.    The diffusion-weighted sequences exhibit no findings suspicious for acute   ischemia. The inversion recovery-FLAIR sequences reveal considerable   increased periventricular increased white matter signal as well as   numerous foci of more peripheral increased white matter signal. The   cerebellum and brainstem are spared. The T1 weighted images reveal   encephalomalacia in the anterior aspect of the right occipital lobe   consistent with previous ischemic insult. There are no findings     suspicious for acute intracranial hemorrhage. The fluid sensitive T2   weighted images reveal no abnormal intra or extra-axial fluid collections   outside of  the right occipital lobe. Cranial nerves VII and VIII appear   normal at the level of the cerebellopontine angles. There are small   amounts of fluid in the left mastoid air cells.    IMPRESSION:   1. There is no evidence of an acute ischemic infarction.  2. There are white matter signal changes consistent with chronic small   vessel ischemia.  3. There is encephalomalacia in the anterior aspect of the right   occipital lobe consistent with a previous CVA.  4. A small amount of fluid is noted in the left mastoid air cells which   may reflect inflammation.   Dictation Site: 1        Verified By: Maryella Shivers., MD  CT:    26-Aug-14 11:34, CT Head Without Contrast  CT Head Without Contrast   REASON FOR EXAM:    not eating not drinking feels weak  COMMENTS:       PROCEDURE: CT  - CT HEAD WITHOUT CONTRAST  - Sep 28 2012 11:34AM     RESULT: Head CT dated 09/28/2012    Comparison made to prior study dated 06/04/2012    Technique: Helical noncontrasted 5 mm sections were obtained from the   skull base through the vertex.    Findings: There is no evidence of acute hemorrhage. An area of   encephalomalacic projects within the medial, superior aspect of the right   occipital lobe adjacent to the atrium of the lateral ventricle. No     further evidence of intra-axial or extra fluid collection identified.   Hydrocephalus ex vacuo, mild is appreciated. Areas of low-attenuation   project within the subcortical, deep, and periventricular white matter   regions. There is no evidence of subfalcine or tonsillar herniation.   Cerebellar atrophy is identified. There is no evidence of depressed skull   fracture. Visualized paranasal sinuses and mastoid air cells are patent.    IMPRESSION:  Chronicand involutional changes without evidence of acute   abnormalities.        Verified By: Mikki Santee, M.D., MD   Radiology Impression: Radiology Impression: MRI of brain personally reviewed by me and shows an old R occipital infarct and moderate white matter changes, nothing acute   Impression/Recommendations: Recommendations:   labs reviewed by me and mildly elevated CPK notes reviewed by me   Bilateral leg weakness-  this seems to be more musculoskeletal in origin;  no clear neurologic source Knee effusions-  painful, not sure if gout or some autoimmune process agree with ortho consult for drainage of effusion agree with PT should be evaluated by neurology again after effusion decrease in 3-4 weeks at [facility] Neuro will sign off  Electronic Signatures: Jamison Neighbor (MD)  (Signed 28-Aug-14  11:43)  Authored: REFERRING PHYSICIAN, Primary Care Physician, Consult, History of Present Illness, Review of Systems, PAST MEDICAL/SURGICAL HISTORY, HOME MEDICATIONS, ALLERGIES, Social/Family History, NURSING VITAL SIGNS, Physical Exam-, LAB RESULTS, RADIOLOGY RESULTS, Recommendations   Last Updated: 28-Aug-14 11:43 by Jamison Neighbor (MD)

## 2014-05-26 NOTE — Discharge Summary (Signed)
PATIENT NAME:  [name] [name] MR#:  [id number] DATE OF BIRTH:  [birth year]  DATE OF ADMISSION:  09/28/2012 DATE OF DISCHARGE:  10/01/2012  PRIMARY CARE PHYSICIAN:  Dr. Silvio Pate.  The patient will be seen at [facility], [place] by Dr. Tana Coast, hospitalist, and Dr. Jamal Collin, mandible surgeon.  FINAL DIAGNOSES:  1.  Jaw dislocation, repeatedly.  2.  Ambulatory dysfunction and knee pain, found to have pseudogout.  3.  Dehydration and mild rhabdomyolysis.  4.  Constipation.  5.  Hypokalemia.  6.  Thrombocytopenia.  7.  Asthma.  8.  Gastroesophageal reflux disease.  CURRENT MEDICATIONS INCLUDE:  Tylenol 650 mg orally q.4 hours p.r.n. pain or temperature, Combivent CFC 1 puff q.i.d., heparin subcutaneous 5000 units q.8, Spiriva 1 inhalation daily, trazodone 50 mg at bedtime, MiraLAX 17 grams oral daily, omeprazole 20 mg b.i.d., Flexeril 10 mg t.i.d. p.r.n. muscle spasm started for the jaw dislocation.   HOSPITAL COURSE:  The patient was admitted 09/28/2012, discharged to [facility] 10/01/2012. Initially, he came in with difficulty with ambulation. Workup was sent off including blood tests. He was given IV fluid hydration and Physical Therapy evaluation.   LABORATORY AND RADIOLOGICAL DATA DURING THIS HOSPITAL COURSE:  Included an EKG that showed normal sinus rhythm, left axis deviation. Urinalysis 1+ ketones, 1+ blood, nitrites and leukocyte esterase negative. CPK 804. TSH at 2.1. Troponin negative. Lipase 40, glucose 102, BUN 16, creatinine 0.65, sodium 135, potassium 3.5, chloride 105, CO2 22, calcium 8.6, total bilirubin 2.7. Alkaline phosphatase 66, ALT 18, AST 31, total protein 7.0. White blood cell count 10.2, H and H 13.9 and 38.8, platelet count of 90. Chest x-ray: Hyperinflation consistent with COPD. CT scan of the head:  Chronic and involutional changes without evidence of acute abnormalities. CT scan of the abdomen and pelvis showed a moderate to large amount of fecal retention, gallstones,  atelectasis versus infiltrate left lung base, mild hepatic steatosis, no evidence of inflammatory changes. Rheumatoid titer 15.5. ANA comprehensive panel was negative. Sedimentation rate 35. MRI of the brain:  Negative for acute event, and there are white matter signal changes consistent with chronic small vessel ischemia, encephalomalacia anterior aspect of the right occipital lobe consistent with a previous CVA. vitamin B12 451. CPK on the 28th came down to 151. Hep-C virus antibody negative. Uric acid 3.1. ANA negative. Left knee complete showed soft tissue swelling, may reflect bursitis or joint effusion. Right knee complete moderate degenerative changes medial joint compartments. No joint effusion, suprapatellar bursitis. Joint aspiration, right knee, showed nucleated cells of 35,636, 87% neutrophils, one lymphocyte, 12% macrophages, calcium pyrophosphate crystals.   CONSULTANTS DURING THE HOSPITAL COURSE INCLUDE:  Physical Therapy and Occupational Therapy, Dr. Jefm Bryant, Rheumatology, Dr. Valora Corporal, Neurology, Nadeen Landau, ENT.   HOSPITAL COURSE PER PROBLEM LIST:  1.  For the patient's jaw dislocation. This happened on 10/01/2012. I saw the patient was unable to reduce his jaw. I did call Nadeen Landau, ENT, who evaluated the patient and was able to reduce his jaw. His jaw then popped out a little bit later, popped back in, and popped out again. The last I saw him, his jaw was out. I did speak with Dr. Jamal Collin at [facility] who said that he would see the patient in consultation. The patient was accepted by Dr. Tana Coast, Internal Medicine, to their service. The patient will be on liquid diet while the jaw issue is undergoing. The patient may need a definitive treatment based on the Dr. Angie Fava evaluation at [facility].  2.  For the patient's ambulatory dysfunction. This is likely secondary to knee pain and pseudogout. The patient did have a trial of systemic steroids. I did consult Dr. Jefm Bryant,  Rheumatology, who drained and injected both knees with steroid. The patient does have better range of motion of his knees today. I was planning on sending the patient out to either rehab or home depending on Physical Therapy's evaluation today, but this was delayed secondary to the patient's jaw dislocation.  3.  Dehydration and mild elevation of CPK, mild rhabdomyolysis. CPK normalized with IV fluid hydration and fluids were stopped.  4.  Constipation. The patient was given MiraLAX.  5.  Hypokalemia. This was replaced during the hospital stay.  6.  Thrombocytopenia. Hepatitis-C was negative. I did look back at old labs. His platelet count in the past was on the lower side at 138 so this is not new, can be worked up further as outpatient.  7.  Asthma. Respiratory status is stable. The patient takes Spiriva at home.  8.  Gastroesophageal reflux disease. The patient is on omeprazole.   TIME SPENT ON PATIENT CARE TODAY:  65 minutes with coordination of care.  ____________________________ Tana Conch. Leslye Peer, MD [id number] D: 10/01/2012 15:31:21 ET T: 10/01/2012 15:41:24 ET JOB#: [id number]  cc: Tana Conch. Leslye Peer, MD, <Dictator> Venia Carbon, MD Please send a copy along with the patient to [place] MD ELECTRONICALLY SIGNED 10/05/2012 12:20

## 2014-05-26 NOTE — H&P (Signed)
PATIENT NAME:  [name] [name], [name] [name] MR#:  [id number] DATE OF BIRTH:  January 20, 1935  DATE OF ADMISSION:  09/28/2012  PRIMARY CARE PHYSICIAN: Venia Carbon, MD  EMERGENCY DEPARTMENT REFERRING PHYSICIAN: Conni Slipper, MD  CHIEF COMPLAINT: Difficulty with ambulation.   HISTORY OF PRESENT ILLNESS: The patient is a 80 year old white male with previous history of asthma, anxiety disorder, prostate cancer, status post prostatectomy, who reports that he was doing okay up until March when he started having problem with ambulation. He states that it started with cramps in his legs. He was seen by Dr. Silvio Pate and had x-rays done, which apparently showed osteoarthritis. The patient reports that initially he was walking with a walker, but now he is unable to walk at all. He is mostly bedridden. He states that every time he tries to walk, he feels very weak and is unable to ambulate. The patient also reports that he feels sleepy all the time. He is not having any weakness in his upper extremities. He has not noticed any rash. He otherwise denies any chest pain or shortness of breath. He does not have any tremors. He has no problems with his memory.   PAST MEDICAL HISTORY: Significant for:  1.  Asthma.  2.  History of anxiety disorder.  3.  History of prostate cancer, status post prostatectomy.   PAST SURGICAL HISTORY: Only prostatectomy.   ALLERGIES: None.   CURRENT MEDICATIONS: Ibuprofen 200 mg q.4 to 6 p.r.n. for headache, Spiriva 18 mcg daily, trazodone 50 at bedtime.   SOCIAL HISTORY: History of trying smoking, but does not smoke chronically. No alcohol or drug use. Lives with his wife.   FAMILY HISTORY: Mother with severe osteoarthritis.   REVIEW OF SYSTEMS:    CONSTITUTIONAL: Denies any fevers. Complains of severe fatigue, weakness. No pain. He has lost some weight. He is not sure how much.  EYES: No blurred or double vision. No pain. No redness. No inflammation. No glaucoma. No cataracts.  EARS, NOSE,  THROAT: No tinnitus. No ear pain or hearing loss. No seasonal or year-round allergies. No difficulty swallowing. No postnasal drip. No sinus pain.  RESPIRATORY: Denies any cough. Occasional wheezing, has asthma. No hemoptysis. No TB. No pneumonia.  CARDIOVASCULAR: Denies any chest pain, orthopnea, edema, arrhythmia or syncope.  GASTROINTESTINAL: No nausea, vomiting, diarrhea. No abdominal pain. No hematemesis. No melena. No guarding. No IBS. No jaundice. Has constipation.  GENITOURINARY: Denies any dysuria, hematuria, renal calculus or frequency.  ENDOCRINE: Denies any polyuria, nocturia or thyroid problems.  HEMATOLOGIC AND LYMPHATIC: Denies anemia, easy bruisability or bleeding.  SKIN: No acne. No rash. No changes in mole, hair or skin.  MUSCULOSKELETAL: Has severe pain in his knees. NEUROLOGIC: No numbness. No CVA. No TIA. No seizures. PSYCHIATRIC: Has a history of anxiety disorder. No insomnia. No ADD.   PHYSICAL EXAMINATION: VITAL SIGNS: Temperature 99.9, pulse 90, respirations 20, blood pressure 157/79, O2 of 97%.  GENERAL: The patient is a chronically-appearing debilitated male. In no acute distress currently.  HEENT: Head atraumatic, normocephalic. Pupils equally round, reactive to light and accommodation. There is no conjunctival pallor. No scleral icterus. Nasal exam shows no drainage or ulceration. Oropharynx is clear without any exudate.  NECK: Supple without JVD.  CARDIOVASCULAR: Regular rate and rhythm. No murmurs, rubs, clicks or gallops. PMI is not displaced.  LUNGS: Clear to auscultation bilaterally without any rales, rhonchi, wheezing.  ABDOMEN: Soft, nontender, nondistended. Positive bowel sounds x 4. No hepatosplenomegaly.  SKIN: No rash.  LYMPHATICS: No lymph nodes  palpable.  VASCULAR: Good DP, PT pulses.  PSYCHIATRIC: Not anxious or depressed.  NEUROLOGIC: Cranial nerves II through XII grossly intact. He has diffuse weakness throughout all extremities.  MUSCULOSKELETAL:  There is no erythema or swelling. There is decreased movement in  both of his knees, right greater than left, with some crepitus felt on examination.   LABORATORY DATA: In the ED: His glucose was 102, BUN 16, creatinine 0.65, sodium 135, potassium 3.5, chloride 104; CO2 is 22, lipase 40, total protein 7.0, albumin 3.5, bilirubin total 2.7, alkaline phosphatase 66, AST 31. Troponin 0.03. TSH 2.10. WBC 10.2, hemoglobin 13.9; platelet count is 90.   ASSESSMENT AND PLAN: The patient is a 80 year old white male with progressive ambulatory dysfunction.  1.  Ambulatory dysfunction: Differential diagnosis includes possible myopathy. Possible inflammatory arthropathy. Possible neurological disorder. At this time, will place him under observation. Will give him IV fluids. I will check a sedimentation rate, ANA, rheumatoid factor, CPK level. Based on this evaluation, he will need neurology and rheumatology evaluation. The patient will also get PT and OT evaluation. We will try to ambulate the patient. The patient may need placement.  2.  Dehydration: Intravenous fluids.  3.  Constipation noted on CT scan: The patient had multiple large bowel movements after receiving the contrast, so this has resolved.  4.  History of asthma: Will use as-needed Combivent.  5.  Thrombocytopenia: Not clear whether this is chronic. This could represent chronic idiopathic thrombocytopenia purpura. Will need outpatient hematology evaluation. We will hold off on heparin or Coumadin for the time being.    NOTE: 50 minutes spent on this H and P.     ____________________________ Lafonda Mosses. Posey Pronto, MD shp:jm D: 09/28/2012 15:44:41 ET T: 09/28/2012 16:01:29 ET JOB#: [id number]  cc: Guillermo Nehring H. Posey Pronto, MD, <Dictator> Alric Seton MD ELECTRONICALLY SIGNED 09/28/2012 18:26

## 2014-05-26 NOTE — Consult Note (Signed)
PATIENT NAME:  [name] [name], [name] [name] MR#:  [id number] DATE OF BIRTH:  Mar 19, 1934  DATE OF CONSULTATION:  10/01/2012  REFERRING PHYSICIAN:  Loletha Grayer, MD CONSULTING PHYSICIAN:  Janalee Dane, MD  HISTORY OF PRESENT ILLNESS: The patient is a very pleasant elderly white male who has a history of "jaw locking open", but he has until this morning always been able to pull it out and manually reduce it himself. Early this morning he yawned. The mandible subluxed and he was unable to manually reduce it. He is currently in no severe distress, no airway distress, but he cannot close his mouth. He denies any other recent trauma, dental work or other ENT type problems. He did undergo Kenalog injections in both knees yesterday by Dr. Franchot Mimes, and he is scheduled to see the physical therapist today to see about his ambulation and to evaluate as to whether or not he needs to go to rehab. He lives at home with his wife who is demented and has a difficult social situation with her children. Allergies, medications, past medical history and past surgical history reviewed and documented in the chart.   PHYSICAL EXAMINATION:  GENERAL: Elderly white male with mouth wide open. Unable to close his jaw.  NOSE: Intranasal mucosa is pale. No masses or lesions intranasally. No purulence. No polyps. ORAL CAVITY AND OROPHARYNX: There is significant bone resorption in the mandible. There are a few teeth on the right, 2 on the left, but complete alveolar resorption other than around these teeth. There is also significant reabsorption in the maxillary alveolar ridge. NECK: The trachea is midline. There are no other lesions or masses palpable in the neck.  PROCEDURE: Manual reduction of anterior condyle subluxation.   DESCRIPTION OF PROCEDURE: The patient was given 4 mg of IV morphine. After he was relaxed from the morphine, bimanual palpation intraorally and extraorally was carried out. The anteriorly subluxed condyles were  manually reduced. The patient experienced significant relief, although still very painful in the temporomandibular joints.   IMPRESSION: Temporomandibular joint dislocation (recurrent).    PLAN: At this point, the patient was recommended to be on a mechanical soft diet, to avoid yawning and to avoid opening his mouth significantly. He was placed on Flexeril for muscle relaxation and to avoid the spasticity of the muscles of mastication.   ADDENDUM: I was called about 5 hours after the initial call and manual reduction of the temporomandibular joint dislocation. He had become dislocated again, and it was remaining so for about an hour. I manually reduced him again and discussed the fact that this will require more aggressive treatment. I called one of my oral surgery colleagues and he recommends mandibulomaxillary fixation, which I checked with our operating room and we do not have the equipment for that. I also do not have the experience or expertise in this particular area to create adequate mandibulomaxillary fixation in a patient with dentition of this kind and significant mandibular bone loss. I discussed the case with Dr. Leslye Peer, and he will arrange for transfer to a tertiary care medical facility. ____________________________ J. Nadeen Landau, MD jmc:sb D: 10/01/2012 12:44:27 ET T: 10/01/2012 13:09:32 ET JOB#: [id number]  cc: Janalee Dane, MD, <Dictator> [place] MD ELECTRONICALLY SIGNED 10/29/2012 9:51

## 2014-05-26 NOTE — Consult Note (Signed)
Brief Consult Note: Patient was seen by consultant.   Consult note dictated.   Comments: bilateral iflammatory knee effusions, both knees aspirated 80cc inflammatory fliud, sent for microscopy, suspect CPPD, both injected with kenalog.  Electronic Signatures: Leeanne Mannan., Eugene Gavia (MD)  (Signed 28-Aug-14 17:30)  Authored: Brief Consult Note   Last Updated: 28-Aug-14 17:30 by Leeanne Mannan., Eugene Gavia (MD)

## 2014-05-26 NOTE — Consult Note (Signed)
PATIENT NAME:  [name] [name], [name] [name] MR#:  [id number] DATE OF BIRTH:  [birth year]  DATE OF CONSULTATION:  09/30/2012  CONSULTING PHYSICIAN:  Meda Klinefelter., MD  REASON FOR CONSULTATION: Knee pain.   HISTORY OF PRESENT ILLNESS: A 80 year old white male who has history of osteoarthritis, asthma, prior prostate cancer. He has known osteoarthritis of the knees. About six months ago he had aspiration of the right knee. He was weeding last week and felt like he had sudden onset of pain in his right knee then his left knee. He got to the point where he could not ambulated or bear weight on his leg, so he came to the Emergency Room where he had evaluation for weakness. Knees have still been swollen and he says he cannot even walk in the room because his knees are so painful. He has never had podagra.   Sed rate was elevated at 35. Rheumatoid factor 15. Hands have never bothered him. He has mild proteinuria at 30 mg/dL. CPK was elevated at 804 but a repeat was 300. White count was unremarkable. He has not had any significant fever. He has not particularly been stiff in the morning, and prior to this admission, he was able to ambulate without a cane or walker.   PAST MEDICAL HISTORY: Prostate cancer, old CVA, COPD, surgical prostatectomy.   ALLERGIES: None.  MEDICATIONS: Ibuprofen every 4 hours p.r.n., [id number] mcg daily, trazodone 50 mg at bedtime.   SOCIAL HISTORY: Smokes. No significant alcohol use.   FAMILY HISTORY: Positive for osteoarthritis.   REVIEW OF SYSTEMS: No numbness or tingling. Weight has been stable. No chest pain or abdominal pain. No renal insufficiency. He does have thrombocytopenia.   PHYSICAL EXAMINATION:  GENERAL: Pleasant male in no acute distress. Lying in bed and knees flexed on a pillow.  VITAL SIGNS: Blood pressure 151/75, pulse 94, temperature 98.  HEENT: Sclerae clear. Clear oropharynx.  CHEST: Clear. No visceromegaly.  PULMONARY: Good distal pulses, 1+ edema.   MUSCULOSKELETAL: Good range of motion of his shoulders. Elbows without synovitis. Hands are without significant synovitis. Hips have mild decrease in internal/external rotation. Bilateral knee effusions, large. Symmetric reflexes.   IMPRESSION:  Bilateral knee arthropathy. An x-ray showed degenerative changes as well as chondrocalcinosis.   PROCEDURE #1: The right knee was prepped in a sterile manner. I aspirated 40 mL of inflammatory fluid, sent for microscopy. I injected with 2 mL Xylocaine and 2 mL Marcaine and 1 mL of Kenalog.   PROCEDURE #2: The left knee was prepped in a sterile manner. I aspirated 80 mL of inflammatory fluid, injected with 1 mL Kenalog, 2 mL Xylocaine, 2 mL Marcaine.   IMPRESSION:  Suspect acute pseudogout of both knees. Known osteoarthritis.   PLAN: Low dose anti-inflammatory drugs if he is able to tolerate that. PT. Will probably need orthopedic evaluation of the knees for possible replacement on down the road. He does have thrombocytopenia, and that may need to be addressed if acute. H does have mild proteinuria on urinalysis. Not enough evidence to suspect rheumatoid arthritis.    ____________________________ Meda Klinefelter., MD gwk:np D: 09/30/2012 17:37:16 ET T: 09/30/2012 20:17:31 ET JOB#: [id number]  cc: Meda Klinefelter., MD, <Dictator> Venia Carbon, MD  Ovidio Hanger MD ELECTRONICALLY SIGNED 10/01/2012 12:08

## 2014-08-21 IMAGING — CR DG KNEE 1-2V*R*
3 series · 3 of 3 positions shown · non-contrast
Comparison: None.

CLINICAL DATA: Pain and crepitus

RIGHT KNEE - 3 VIEW

[view not recorded (1 of 3)]
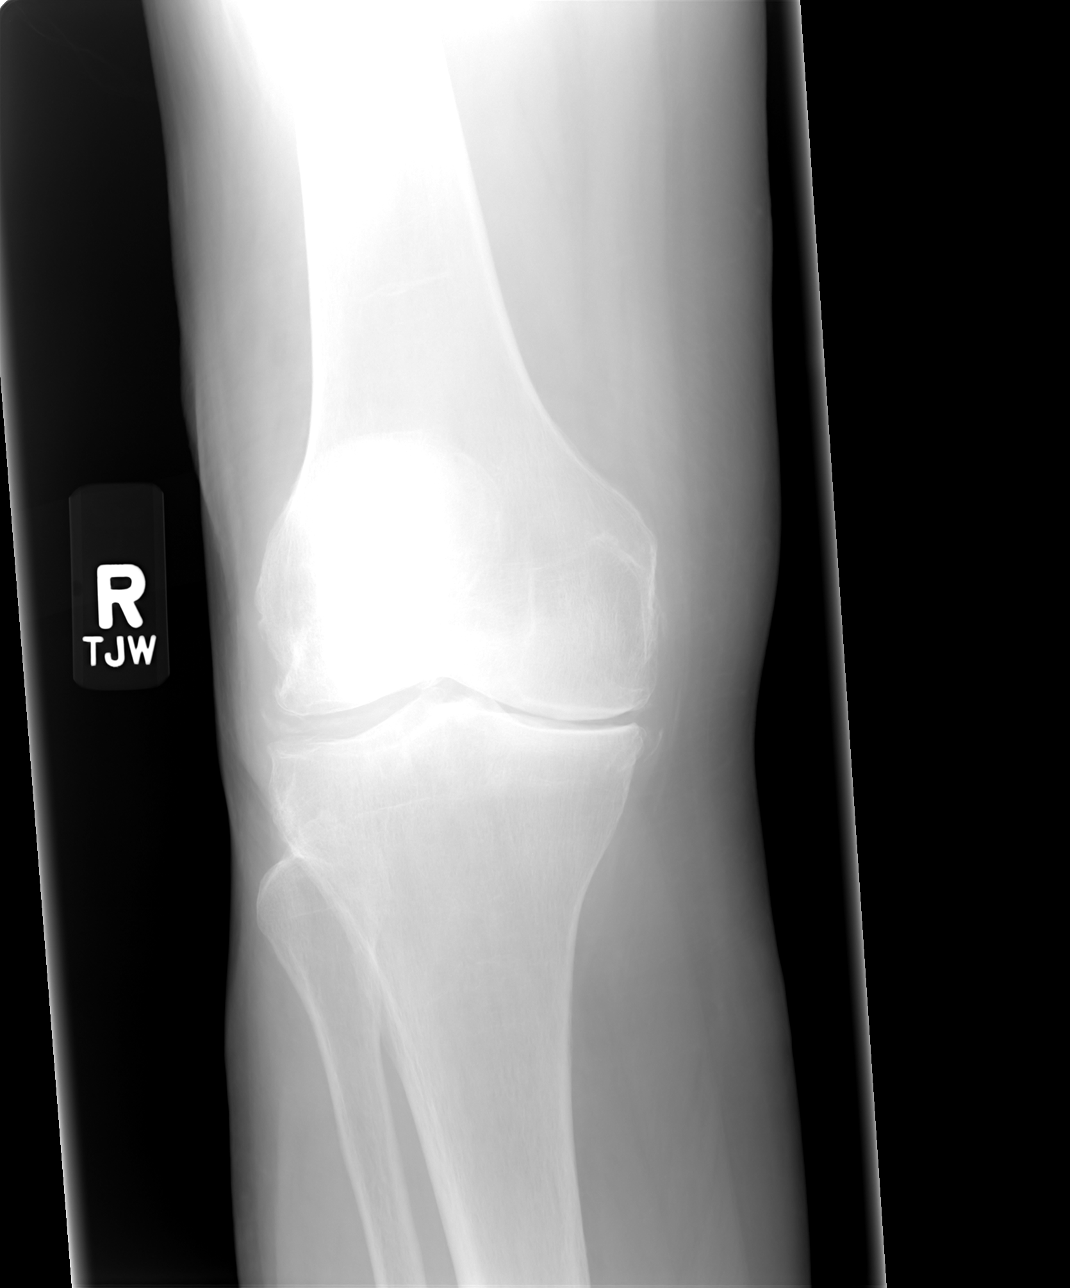

[view not recorded (2 of 3)]
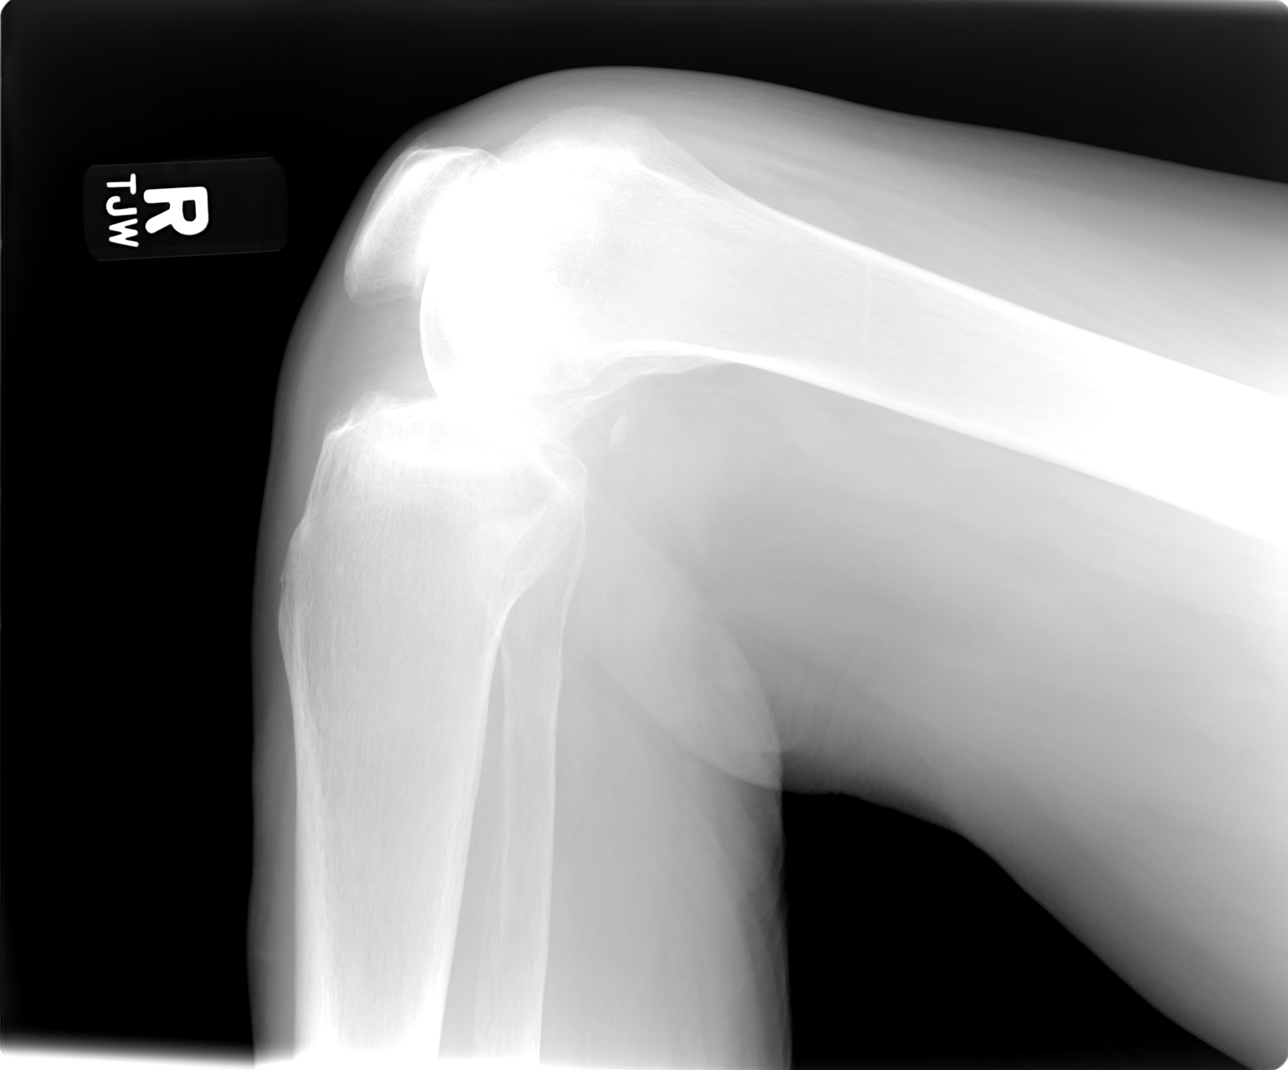

[view not recorded (3 of 3)]
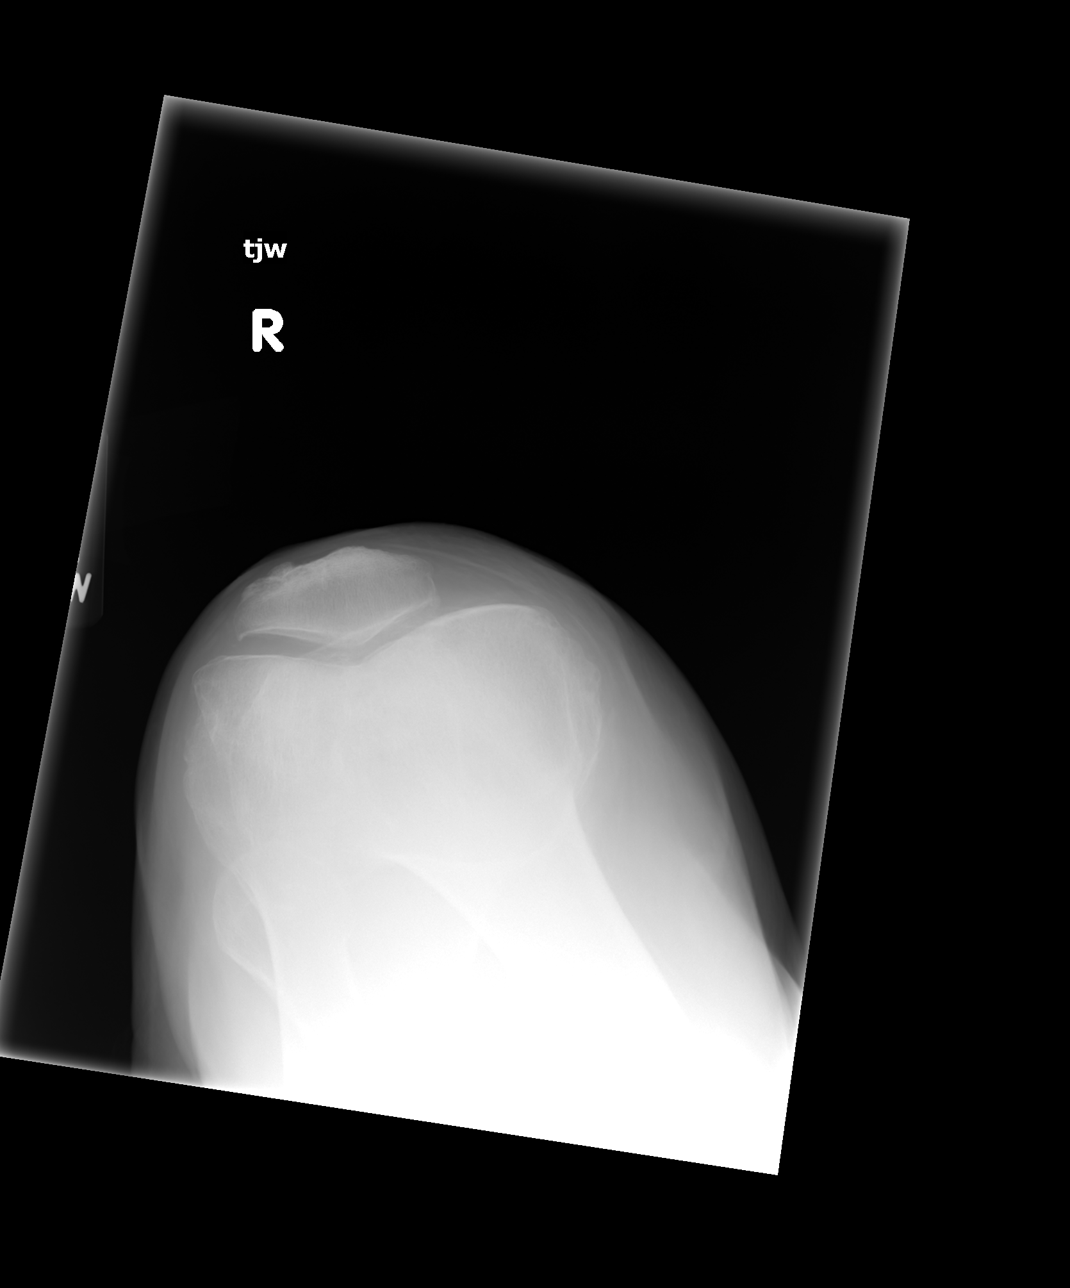

[3 of 3 positions shown; findings below may reference images not displayed]

FINDINGS: Frontal, lateral, and sunrise patellar images were
obtained.  There is no fracture, dislocation, or effusion.  Patella
is mildly inferiorly positioned on the lateral view.  There is
marked narrowing medially. There is moderate patellofemoral joint
space narrowing.  There is extensive meniscal calcification. No
erosive change.
IMPRESSION: Patella is somewhat inferiorly positioned on the lateral view.
Question pathology involving the quadriceps tendon.

Generalized osteoarthritic change, most marked medially.  There is
extensive meniscal calcification.  While this finding may be due to
osteoarthritis, this finding may also be seen with calcium
pyrophosphate deposition disease which may represent clinically as
pseudogout.

No fracture or effusion.

## 2014-08-31 IMAGING — CR DG CHEST 2V
1 series · 4 of 4 positions shown · non-contrast
Comparison: none

REASON FOR EXAM: night sweats, weak
COMMENTS:

PROCEDURE:     DXR - DXR CHEST PA (OR AP) AND LATERAL  - June 04, 2012  [DATE]
RESULT:     History: Night sweats.
Comparison Study: No prior.

[Series 1: x chest ap · 0.14mm/px · 4 of 4 slices shown]
[im 1/4]
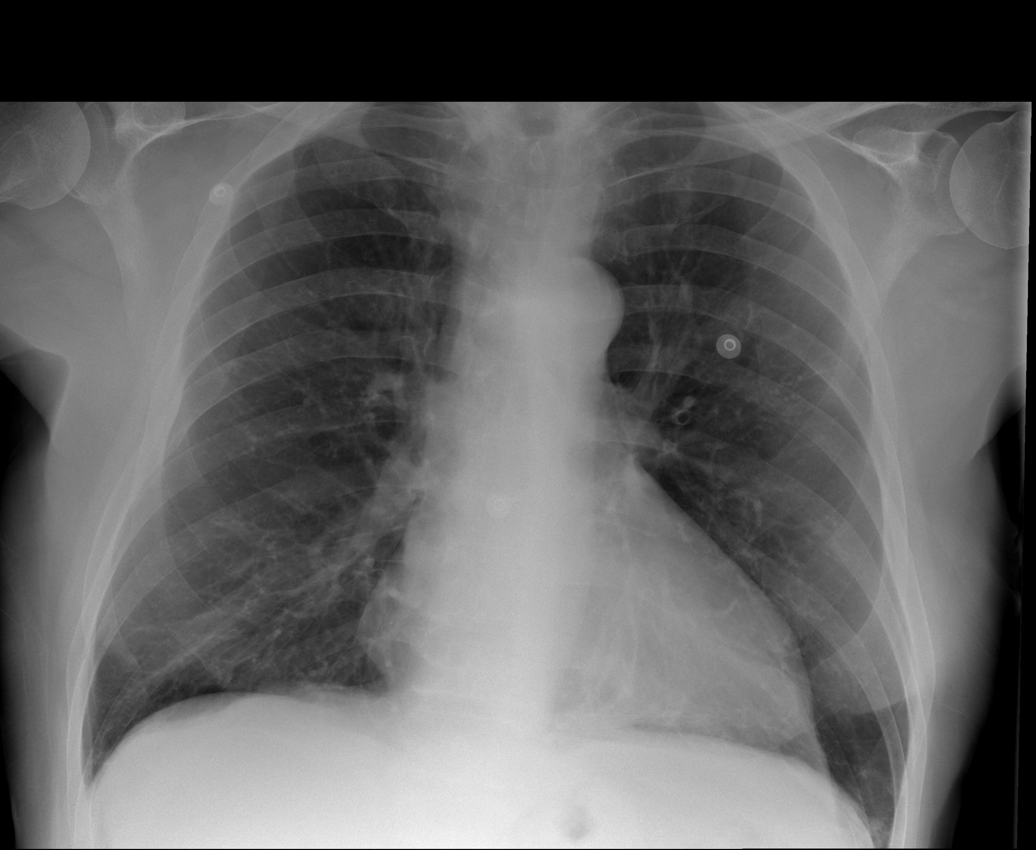
[im 2/4]
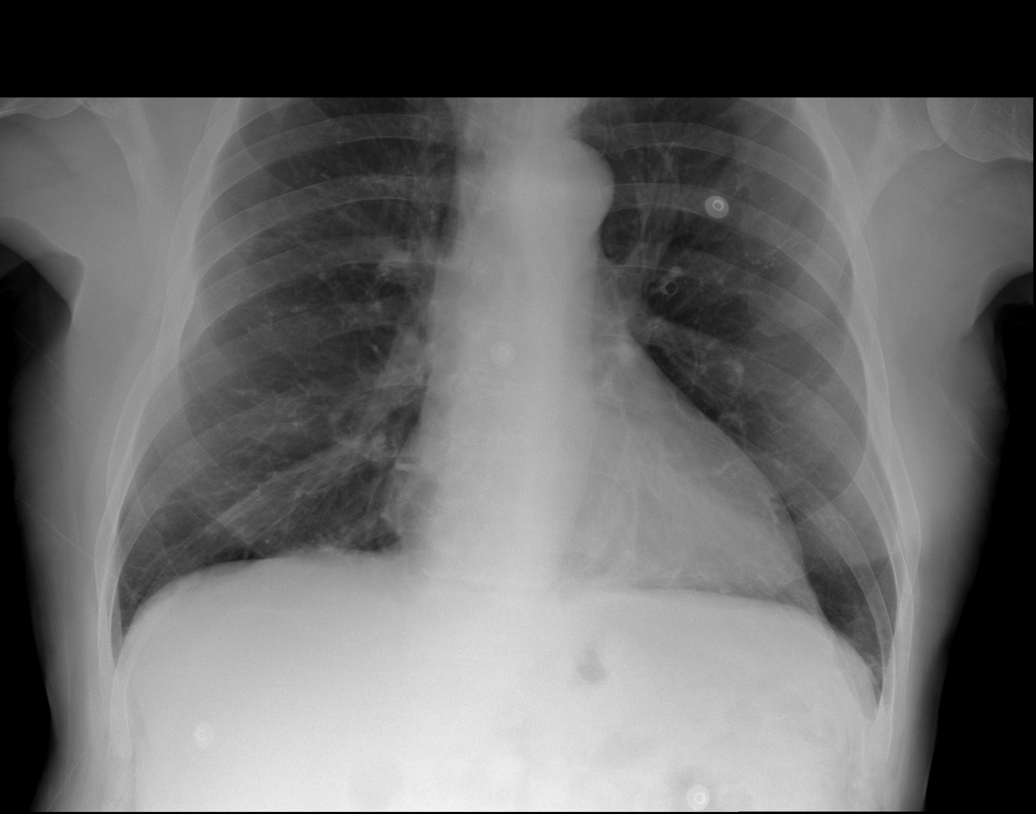
[im 3/4]
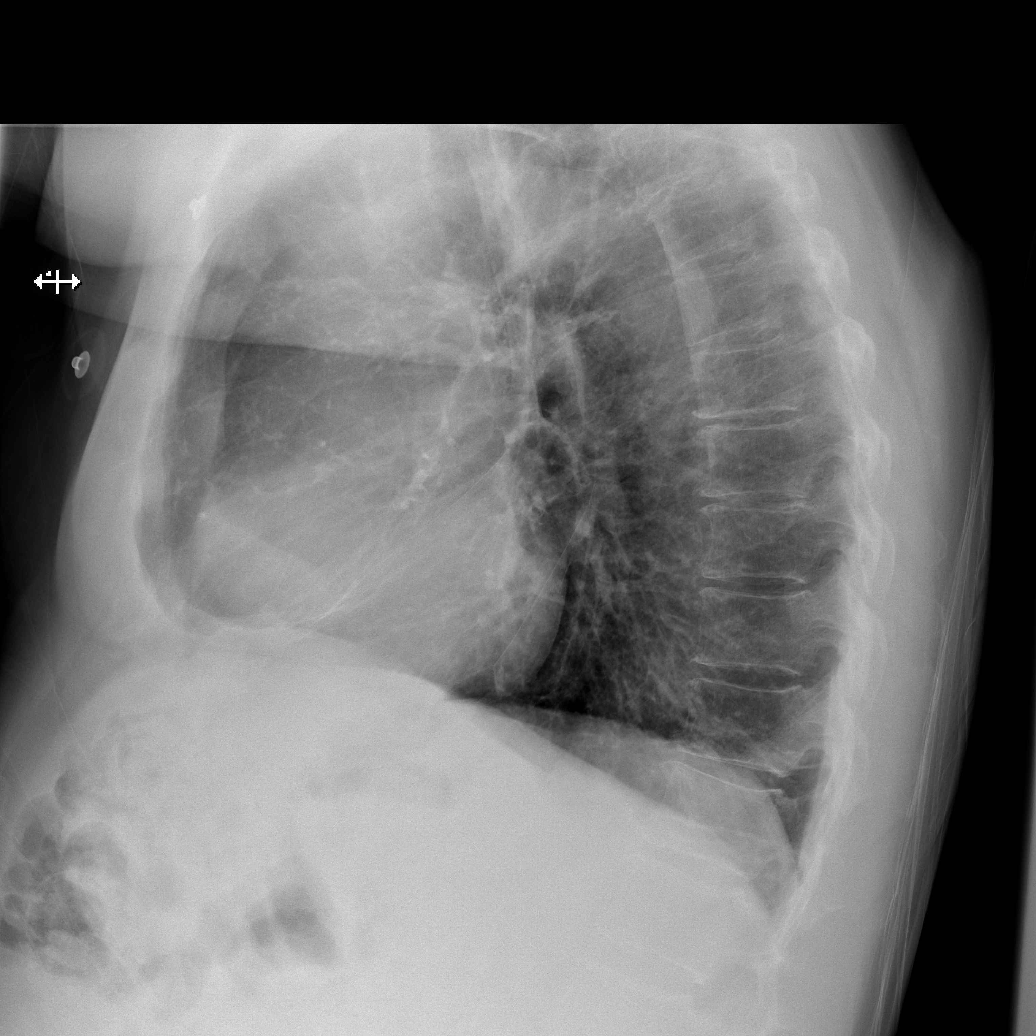
[im 4/4]
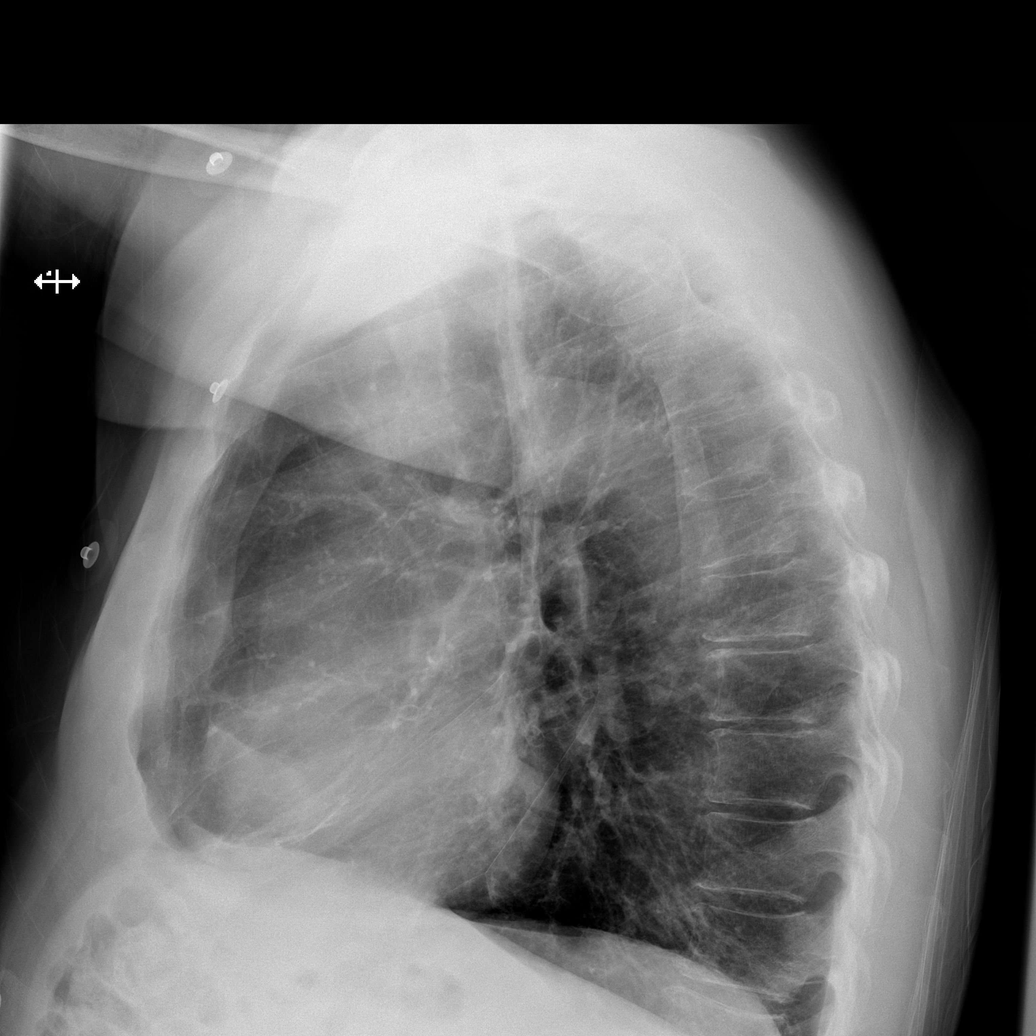

[4 of 4 positions shown; findings below may reference images not displayed]

FINDINGS: Mild infiltrate the right lung base cannot be excluded. Slight
nodularity present. Followup chest x-ray to demonstrate clearing suggested.
If right lower lobe nodular infiltrate does not clear chest, CT can be
obtained. No bony abnormality.
IMPRESSION: Mild right lower lobe infiltrate slight nodularity.
Findings most consistent mild pneumonia. Followup chest x-ray however is
recommended to demonstrate complete clearing.
